# Patient Record
Sex: Female | Born: 1960
Health system: Southern US, Community
[De-identification: ages and names within clinical notes are randomized; demographics above are authoritative.]

## PROBLEM LIST (undated history)

## (undated) DIAGNOSIS — I10 Essential (primary) hypertension: Secondary | ICD-10-CM

## (undated) DIAGNOSIS — I2699 Other pulmonary embolism without acute cor pulmonale: Secondary | ICD-10-CM

## (undated) DIAGNOSIS — K219 Gastro-esophageal reflux disease without esophagitis: Secondary | ICD-10-CM

## (undated) DIAGNOSIS — C801 Malignant (primary) neoplasm, unspecified: Secondary | ICD-10-CM

## (undated) DIAGNOSIS — J45909 Unspecified asthma, uncomplicated: Secondary | ICD-10-CM

## (undated) DIAGNOSIS — G4730 Sleep apnea, unspecified: Secondary | ICD-10-CM

## (undated) DIAGNOSIS — E119 Type 2 diabetes mellitus without complications: Secondary | ICD-10-CM

## (undated) DIAGNOSIS — K56600 Partial intestinal obstruction, unspecified as to cause: Secondary | ICD-10-CM

## (undated) HISTORY — DX: Essential (primary) hypertension: I10

## (undated) HISTORY — DX: Type 2 diabetes mellitus without complications: E11.9

## (undated) HISTORY — DX: Unspecified asthma, uncomplicated: J45.909

---
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
## 2000-02-14 ENCOUNTER — Other Ambulatory Visit: Admission: RE | Admit: 2000-02-14 | Discharge: 2000-02-14 | Payer: Self-pay | Admitting: Gynecology

## 2000-03-25 ENCOUNTER — Encounter: Admission: RE | Admit: 2000-03-25 | Discharge: 2000-06-23 | Payer: Self-pay | Admitting: Gynecology

## 2000-04-17 ENCOUNTER — Encounter (INDEPENDENT_AMBULATORY_CARE_PROVIDER_SITE_OTHER): Payer: Self-pay

## 2000-04-17 ENCOUNTER — Ambulatory Visit (HOSPITAL_COMMUNITY): Admission: RE | Admit: 2000-04-17 | Discharge: 2000-04-17 | Payer: Self-pay | Admitting: Gynecology

## 2000-10-28 ENCOUNTER — Encounter (INDEPENDENT_AMBULATORY_CARE_PROVIDER_SITE_OTHER): Payer: Self-pay

## 2000-10-28 ENCOUNTER — Inpatient Hospital Stay (HOSPITAL_COMMUNITY): Admission: RE | Admit: 2000-10-28 | Discharge: 2000-10-30 | Payer: Self-pay | Admitting: Gynecology

## 2000-11-10 ENCOUNTER — Encounter: Payer: Self-pay | Admitting: Gynecology

## 2000-11-10 ENCOUNTER — Ambulatory Visit (HOSPITAL_COMMUNITY): Admission: RE | Admit: 2000-11-10 | Discharge: 2000-11-10 | Payer: Self-pay | Admitting: Gynecology

## 2000-11-12 ENCOUNTER — Encounter (INDEPENDENT_AMBULATORY_CARE_PROVIDER_SITE_OTHER): Payer: Self-pay | Admitting: *Deleted

## 2000-11-12 ENCOUNTER — Inpatient Hospital Stay (HOSPITAL_COMMUNITY): Admission: EM | Admit: 2000-11-12 | Discharge: 2000-11-20 | Payer: Self-pay | Admitting: Emergency Medicine

## 2000-11-12 ENCOUNTER — Encounter (INDEPENDENT_AMBULATORY_CARE_PROVIDER_SITE_OTHER): Payer: Self-pay | Admitting: Specialist

## 2000-11-14 ENCOUNTER — Encounter: Payer: Self-pay | Admitting: Surgery

## 2001-01-06 HISTORY — PX: ABDOMINAL HYSTERECTOMY: SHX81

## 2004-08-09 ENCOUNTER — Emergency Department (HOSPITAL_COMMUNITY): Admission: EM | Admit: 2004-08-09 | Discharge: 2004-08-09 | Payer: Self-pay | Admitting: Emergency Medicine

## 2004-08-27 ENCOUNTER — Encounter: Admission: RE | Admit: 2004-08-27 | Discharge: 2004-11-25 | Payer: Self-pay | Admitting: Family Medicine

## 2004-10-06 HISTORY — PX: HERNIA REPAIR: SHX51

## 2005-07-24 ENCOUNTER — Ambulatory Visit: Payer: Self-pay | Admitting: Family Medicine

## 2005-07-28 ENCOUNTER — Ambulatory Visit: Payer: Self-pay | Admitting: Family Medicine

## 2005-08-13 ENCOUNTER — Inpatient Hospital Stay (HOSPITAL_COMMUNITY): Admission: RE | Admit: 2005-08-13 | Discharge: 2005-08-17 | Payer: Self-pay | Admitting: Surgery

## 2005-09-03 ENCOUNTER — Encounter: Admission: RE | Admit: 2005-09-03 | Discharge: 2005-09-03 | Payer: Self-pay | Admitting: Surgery

## 2005-09-05 ENCOUNTER — Ambulatory Visit: Payer: Self-pay | Admitting: Family Medicine

## 2005-09-17 ENCOUNTER — Encounter: Admission: RE | Admit: 2005-09-17 | Discharge: 2005-09-17 | Payer: Self-pay | Admitting: Surgery

## 2005-09-24 ENCOUNTER — Ambulatory Visit: Payer: Self-pay | Admitting: Family Medicine

## 2005-12-08 ENCOUNTER — Ambulatory Visit: Payer: Self-pay | Admitting: Family Medicine

## 2005-12-08 LAB — CONVERTED CEMR LAB
Chloride: 106 meq/L (ref 96–112)
Chol/HDL Ratio, serum: 5.1
Cholesterol: 216 mg/dL (ref 0–200)
Creatinine, Ser: 0.7 mg/dL (ref 0.4–1.2)
Glucose, Bld: 131 mg/dL — ABNORMAL HIGH (ref 70–99)
HDL: 42 mg/dL (ref >39.0)
LDL DIRECT: 173.7 mg/dL
Sodium: 140 meq/L (ref 135–145)

## 2006-01-08 ENCOUNTER — Ambulatory Visit: Payer: Self-pay | Admitting: Family Medicine

## 2006-01-08 LAB — CONVERTED CEMR LAB
BUN: 9 mg/dL (ref 6–23)
Chloride: 103 meq/L (ref 96–112)
Creatinine, Ser: 0.7 mg/dL (ref 0.4–1.2)

## 2006-02-10 ENCOUNTER — Ambulatory Visit: Payer: Self-pay | Admitting: Family Medicine

## 2006-02-10 LAB — CONVERTED CEMR LAB
BUN: 13 mg/dL (ref 6–23)
CO2: 27 meq/L (ref 19–32)
Calcium: 9.7 mg/dL (ref 8.4–10.5)
Chloride: 107 meq/L (ref 96–112)
Creatinine, Ser: 0.7 mg/dL (ref 0.4–1.2)

## 2006-04-15 ENCOUNTER — Ambulatory Visit: Payer: Self-pay | Admitting: Family Medicine

## 2006-04-15 LAB — CONVERTED CEMR LAB
ALT: 14 units/L (ref 0–40)
Creatinine, Ser: 0.7 mg/dL (ref 0.4–1.2)
Creatinine,U: 87.7 mg/dL
GFR calc non Af Amer: 97 mL/min
HDL: 48 mg/dL (ref >39.0)
LDL Cholesterol: 60 mg/dL (ref 0–99)
Potassium: 4.6 meq/L (ref 3.5–5.1)
Sodium: 140 meq/L (ref 135–145)
Triglycerides: 123 mg/dL (ref 0–149)
VLDL: 25 mg/dL (ref 0–40)

## 2006-05-26 ENCOUNTER — Ambulatory Visit: Payer: Self-pay | Admitting: Family Medicine

## 2006-05-26 LAB — CONVERTED CEMR LAB
Calcium: 9.6 mg/dL (ref 8.4–10.5)
Chloride: 105 meq/L (ref 96–112)
GFR calc non Af Amer: 116 mL/min
Glucose, Bld: 110 mg/dL — ABNORMAL HIGH (ref 70–99)

## 2006-06-05 DIAGNOSIS — J45909 Unspecified asthma, uncomplicated: Secondary | ICD-10-CM | POA: Insufficient documentation

## 2006-06-05 DIAGNOSIS — E785 Hyperlipidemia, unspecified: Secondary | ICD-10-CM

## 2006-06-05 DIAGNOSIS — I10 Essential (primary) hypertension: Secondary | ICD-10-CM | POA: Insufficient documentation

## 2006-06-05 DIAGNOSIS — E119 Type 2 diabetes mellitus without complications: Secondary | ICD-10-CM | POA: Insufficient documentation

## 2006-08-04 ENCOUNTER — Ambulatory Visit: Payer: Self-pay | Admitting: Family Medicine

## 2006-08-04 LAB — CONVERTED CEMR LAB
BUN: 14 mg/dL (ref 6–23)
CO2: 27 meq/L (ref 19–32)
GFR calc Af Amer: 117 mL/min
Glucose, Bld: 114 mg/dL — ABNORMAL HIGH (ref 70–99)
Potassium: 4.5 meq/L (ref 3.5–5.1)
Sodium: 138 meq/L (ref 135–145)
Total CHOL/HDL Ratio: 4.5
Triglycerides: 171 mg/dL — ABNORMAL HIGH (ref 0–149)

## 2006-08-05 ENCOUNTER — Telehealth (INDEPENDENT_AMBULATORY_CARE_PROVIDER_SITE_OTHER): Payer: Self-pay | Admitting: *Deleted

## 2006-11-05 ENCOUNTER — Ambulatory Visit: Payer: Self-pay | Admitting: Family Medicine

## 2006-11-05 LAB — CONVERTED CEMR LAB
CO2: 26 meq/L (ref 19–32)
Chloride: 104 meq/L (ref 96–112)
Creatinine, Ser: 0.7 mg/dL (ref 0.4–1.2)
Glucose, Bld: 122 mg/dL — ABNORMAL HIGH (ref 70–99)
Sodium: 139 meq/L (ref 135–145)

## 2006-11-06 ENCOUNTER — Encounter (INDEPENDENT_AMBULATORY_CARE_PROVIDER_SITE_OTHER): Payer: Self-pay | Admitting: *Deleted

## 2006-11-06 ENCOUNTER — Telehealth (INDEPENDENT_AMBULATORY_CARE_PROVIDER_SITE_OTHER): Payer: Self-pay | Admitting: *Deleted

## 2006-12-21 ENCOUNTER — Telehealth (INDEPENDENT_AMBULATORY_CARE_PROVIDER_SITE_OTHER): Payer: Self-pay | Admitting: *Deleted

## 2007-01-13 ENCOUNTER — Telehealth (INDEPENDENT_AMBULATORY_CARE_PROVIDER_SITE_OTHER): Payer: Self-pay | Admitting: *Deleted

## 2007-01-13 ENCOUNTER — Ambulatory Visit: Payer: Self-pay | Admitting: Family Medicine

## 2007-01-13 DIAGNOSIS — J069 Acute upper respiratory infection, unspecified: Secondary | ICD-10-CM | POA: Insufficient documentation

## 2007-01-14 ENCOUNTER — Ambulatory Visit: Payer: Self-pay | Admitting: Family Medicine

## 2007-01-19 ENCOUNTER — Encounter (INDEPENDENT_AMBULATORY_CARE_PROVIDER_SITE_OTHER): Payer: Self-pay | Admitting: *Deleted

## 2007-01-19 LAB — CONVERTED CEMR LAB
GFR calc non Af Amer: 83 mL/min
Glucose, Bld: 134 mg/dL — ABNORMAL HIGH (ref 70–99)
HDL: 42.1 mg/dL (ref >39.0)
Hgb A1c MFr Bld: 7.1 % — ABNORMAL HIGH (ref 4.6–6.0)
LDL Cholesterol: 112 mg/dL — ABNORMAL HIGH (ref 0–99)
Potassium: 4.2 meq/L (ref 3.5–5.1)
Sodium: 139 meq/L (ref 135–145)
Triglycerides: 160 mg/dL — ABNORMAL HIGH (ref 0–149)
VLDL: 32 mg/dL (ref 0–40)

## 2007-02-01 ENCOUNTER — Telehealth (INDEPENDENT_AMBULATORY_CARE_PROVIDER_SITE_OTHER): Payer: Self-pay | Admitting: *Deleted

## 2007-02-05 ENCOUNTER — Telehealth (INDEPENDENT_AMBULATORY_CARE_PROVIDER_SITE_OTHER): Payer: Self-pay | Admitting: *Deleted

## 2007-02-15 ENCOUNTER — Telehealth (INDEPENDENT_AMBULATORY_CARE_PROVIDER_SITE_OTHER): Payer: Self-pay | Admitting: *Deleted

## 2007-02-15 IMAGING — CT CT PELVIS W/O CM
2 of 4 series · 17 of 46 positions shown, 19 images · IV contrast (agent unspecified)
Comparison: none

CLINICAL DATA: Postop ventral hernia repair.  Question abscess. 
 ABDOMEN CT WITHOUT CONTRAST:
TECHNIQUE: Multidetector CT imaging of the abdomen was performed following the standard protocol without IV contrast.
 Apparent scarring is noted in the left lower lobe.  The liver appears normal in the unenhanced state.  [REDACTED] be a small hiatal hernia present.  The gallbladder is visualized with no calcified gallstones.  The pancreas is normal in size as are the adrenal glands and the spleen. The kidneys appear normal in the unenhanced state with no calculi.  The abdominal aorta is normal in caliber.
TECHNIQUE: Multidetector CT imaging of the pelvis was performed following the standard protocol without IV contrast.
 The mesh for repair of midline abdominal wall hernia within the pelvis is noted.  Along the inferior margin of the mesh, there is a linear collection of air which extends toward the skin surface.  There is also a strandiness of the surrounding soft tissues, some of which could be postoperative, but in view of the residual air three weeks postop, cellulitis is a consideration.  There is a small oval area of low attenuation within the soft tissues of the right panniculus which may represent a small abscess of no more than 37 x 25 mm in diameter.  No intraperitoneal abscess is seen. The right rectus musculature is slightly prominent postoperatively. The urinary bladder is unremarkable.  The patient has previously undergone hysterectomy. The appendix appears normal.

[Series 2: routine abd/pelvis · axial · 0.97mm/px · z∈[-435,-20]mm · 14 of 91 slices shown, 16 images]
[im 4/91  soft-tissue]
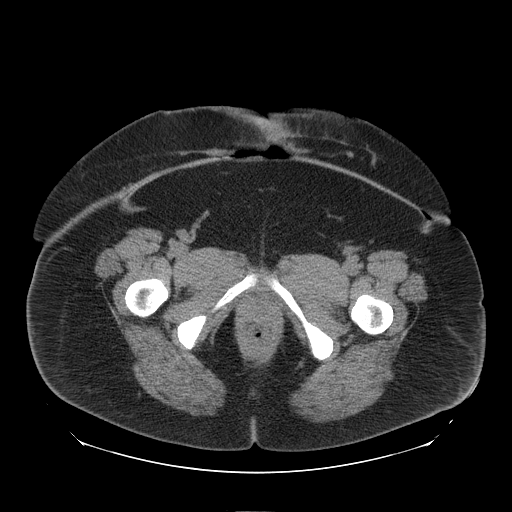
[im 4/91  bone]
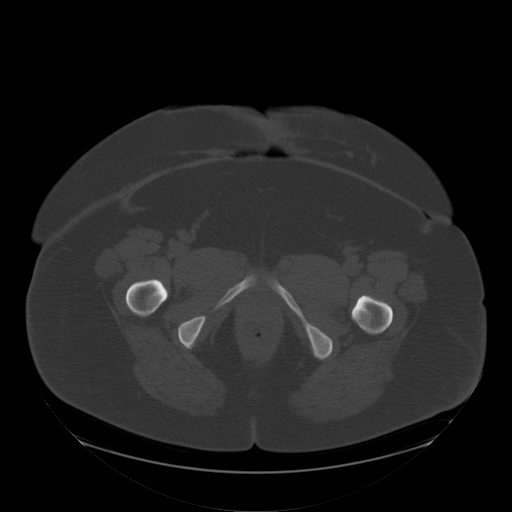
[im 11/91  soft-tissue]
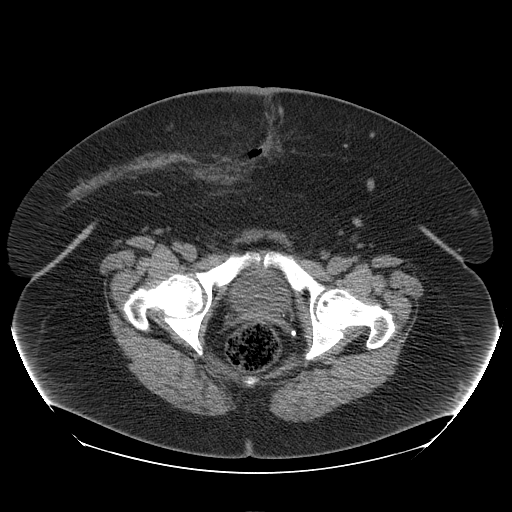
[im 18/91  soft-tissue]
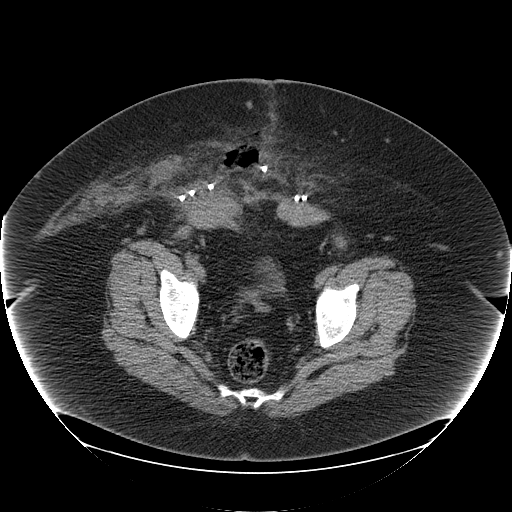
[im 25/91  soft-tissue]
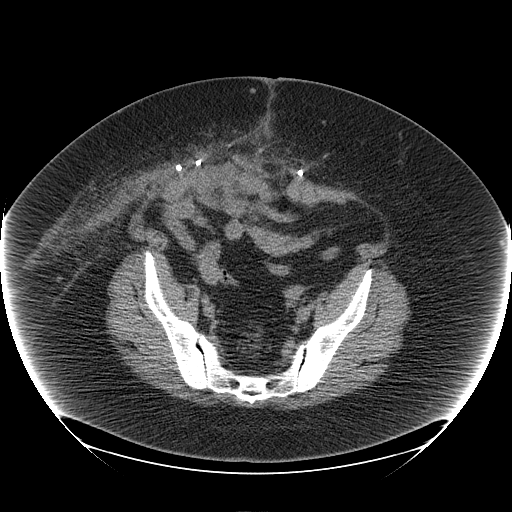
[im 32/91  soft-tissue]
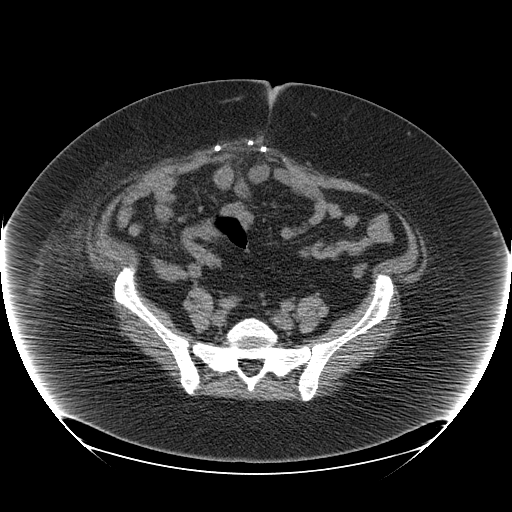
[im 35/91  soft-tissue]
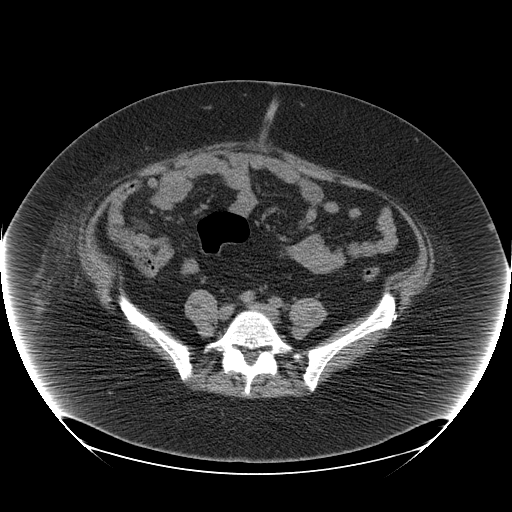
[im 42/91  soft-tissue]
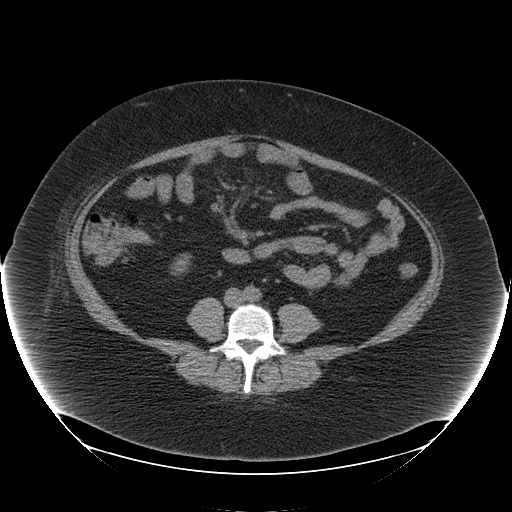
[im 49/91  soft-tissue]
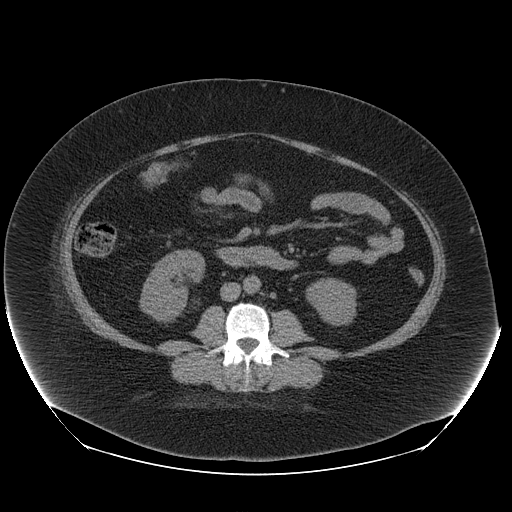
[im 56/91  soft-tissue]
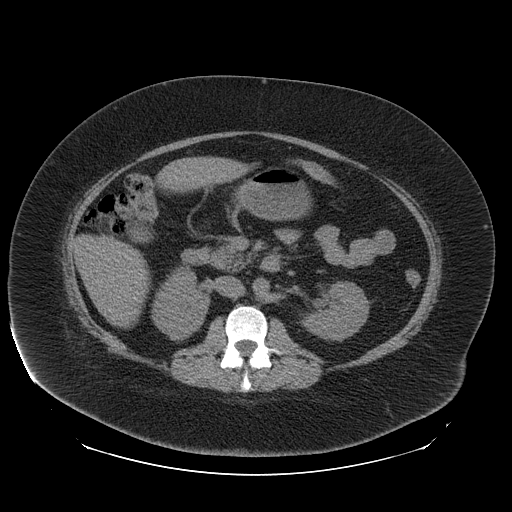
[im 56/91  bone]
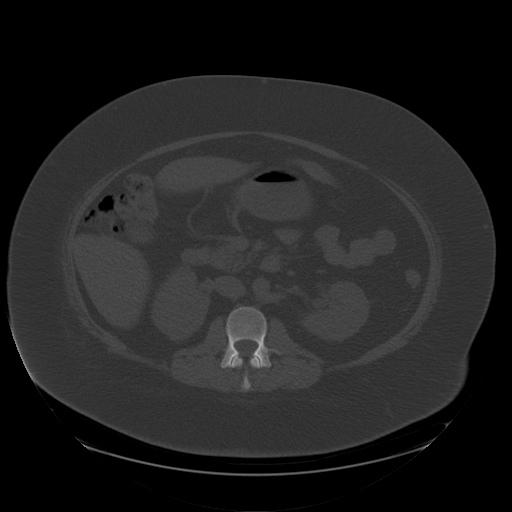
[im 59/91  soft-tissue]
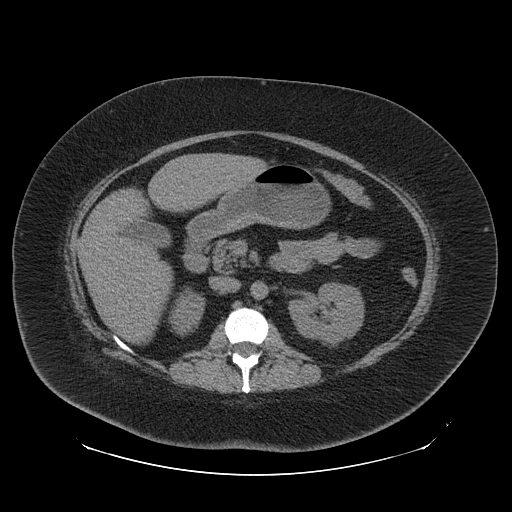
[im 66/91  soft-tissue]
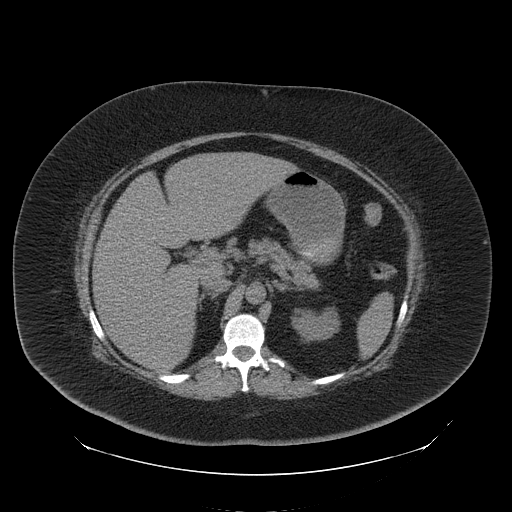
[im 73/91  soft-tissue]
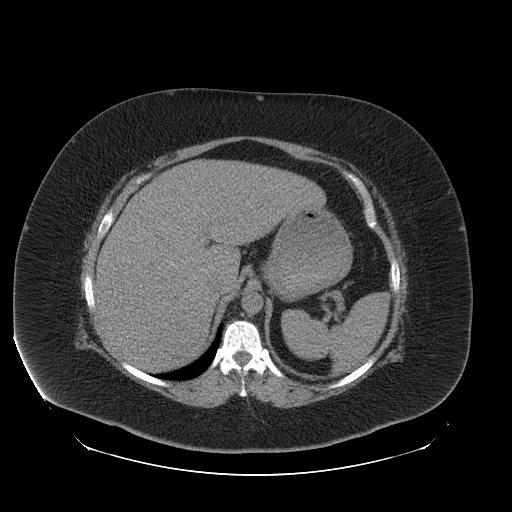
[im 80/91  soft-tissue]
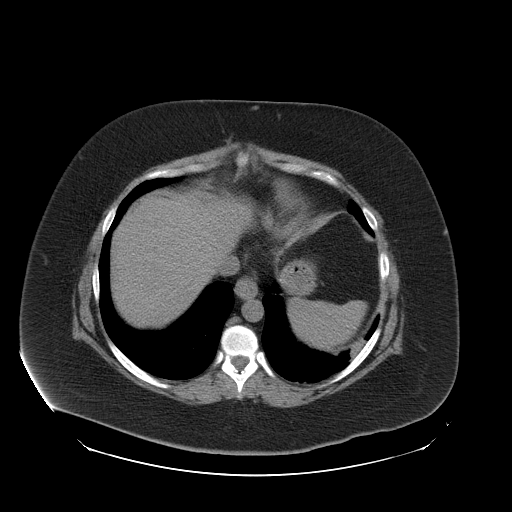
[im 87/91  soft-tissue]
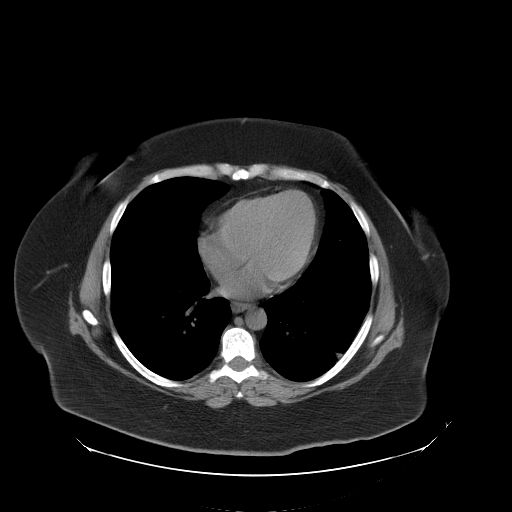

[Series 527: reformatted · sagittal · 0.97mm/px · 3 of 176 slices shown]
[im 59/176  soft-tissue]
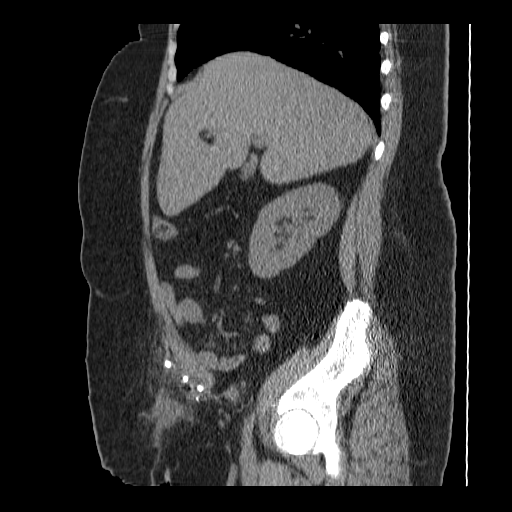
[im 78/176  soft-tissue]
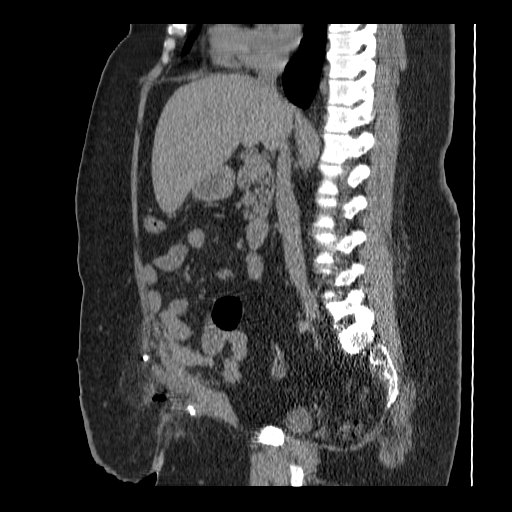
[im 98/176  soft-tissue]
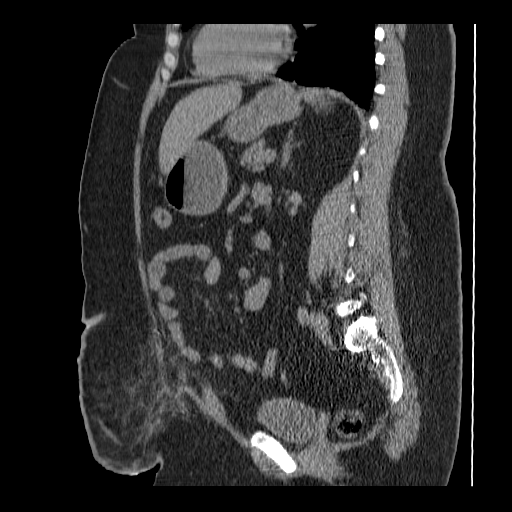

[17 of 46 positions shown; findings below may reference images not displayed]

IMPRESSION: Negative CT of the abdomen. 
 PELVIS CT WITHOUT CONTRAST:
IMPRESSION: 1.  Linear collection of air tracks from the level of the inferior aspect of the mesh toward the skin surface. With surrounding strandiness and small fluid collection in the right panniculus, cellulitis and small abscess is a consideration.
 2.  Appendix appears normal.

## 2007-06-14 ENCOUNTER — Telehealth (INDEPENDENT_AMBULATORY_CARE_PROVIDER_SITE_OTHER): Payer: Self-pay | Admitting: *Deleted

## 2007-06-23 ENCOUNTER — Ambulatory Visit: Payer: Self-pay | Admitting: Vascular Surgery

## 2007-06-23 ENCOUNTER — Encounter: Payer: Self-pay | Admitting: Family Medicine

## 2007-06-23 ENCOUNTER — Ambulatory Visit (HOSPITAL_COMMUNITY): Admission: RE | Admit: 2007-06-23 | Discharge: 2007-06-23 | Payer: Self-pay | Admitting: Family Medicine

## 2007-07-27 ENCOUNTER — Encounter: Admission: RE | Admit: 2007-07-27 | Discharge: 2007-07-27 | Payer: Self-pay | Admitting: Obstetrics & Gynecology

## 2007-08-10 ENCOUNTER — Ambulatory Visit: Payer: Self-pay | Admitting: Vascular Surgery

## 2007-10-19 ENCOUNTER — Ambulatory Visit: Payer: Self-pay | Admitting: Vascular Surgery

## 2007-11-29 ENCOUNTER — Ambulatory Visit: Payer: Self-pay | Admitting: Vascular Surgery

## 2007-12-06 ENCOUNTER — Ambulatory Visit: Payer: Self-pay | Admitting: Vascular Surgery

## 2007-12-23 ENCOUNTER — Ambulatory Visit: Payer: Self-pay | Admitting: Vascular Surgery

## 2008-01-13 ENCOUNTER — Ambulatory Visit: Payer: Self-pay | Admitting: Vascular Surgery

## 2008-08-01 ENCOUNTER — Encounter: Admission: RE | Admit: 2008-08-01 | Discharge: 2008-08-01 | Payer: Self-pay | Admitting: Emergency Medicine

## 2008-08-16 ENCOUNTER — Ambulatory Visit (HOSPITAL_COMMUNITY): Admission: RE | Admit: 2008-08-16 | Discharge: 2008-08-16 | Payer: Self-pay | Admitting: Gastroenterology

## 2010-05-21 NOTE — Assessment & Plan Note (Signed)
OFFICE VISIT   JAMYRIA, OZANICH  DOB:  [birth year]                                       10/19/2007  WUJWJ#:[id number]   The patient returns for further followup regarding severe venous  insufficiency of the left leg.  She has painful varicosities of the  lower thigh and calf as well as painful reticular veins in the lateral  calf area secondary to severe reflux in the left great saphenous vein up  to and including the saphenofemoral junction.  Her deep system is widely  patent.  She has been wearing long-leg elastic compression stockings  (graduated compression 20 mm - 30 mm) and trying elevation of the leg as  much as her job will permit, and ibuprofen, and has had no improvement  in symptomatology.  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  Her legs become more and more symptomatic as the day  progresses.  She also has had some early skin changes in the left ankle.   EXAM:  Today blood pressure 124/78, heart rate is 70, respirations 14.  The painful varicosities are involving the lower thigh and mainly medial  calf area as well as some prominent reticular and spider veins on the  lateral aspect of the calf with some early skin changes distally and  mild edema.   I think we should proceed with laser ablation of the left great  saphenous vein with multiple stab phlebectomies and 2 sessions of  sclerotherapy following this to treat these painful varicosities.  We  will proceed with certification through the insurance company and hope  to perform her procedure in the near future.   Quita Skye Hart [place], M.D.  Electronically Signed   JDL/MEDQ  D:  10/19/2007  T:  10/20/2007  Job:  [id number]

## 2010-05-21 NOTE — Consult Note (Signed)
VASCULAR SURGERY CONSULTATION   Shelly, Russell  DOB:  1960-07-09                                       08/10/2007  GUYQI#:[id number]   The patient was referred by Dr. Alwyn Ren for vascular surgery evaluation  regarding venous insufficiency of both legs.  This 50 year old female  patient who works as a Designer, industrial/product that she has been having increasing  discomfort in the left leg over the last few years particularly the last  several months.  She had an episode of thrombophlebitis in the left leg  in June of this year and was treated with heat and analgesics which  eventually improved but did not completely resolve.  This caused pain in  her left medial calf, medial thigh, and lateral calf.  She does have  some chronic swelling in the leg particularly in the ankle as the day  progresses.  She has not worn elastic compression stockings, elevates  her legs when she is able to, but this is not frequently because of her  job and takes ibuprofen for pain without success.  She describes it as  an aching, throbbing, burning discomfort in the left leg.  She has  symptoms in the right leg as well in the lateral thigh area but not as  severe.   PAST MEDICAL HISTORY:  1. Diabetes.  2. Hypertension.  3. Asthma.  4. Negative for coronary artery disease, stroke.   PREVIOUS SURGERY:  Includes hysterectomy and hernia repair.   FAMILY HISTORY:  Positive for thrombophlebitis in her father.  She also  has a history of coronary artery disease in her father who died of  myocardial infarction.  Negative for stroke.   SOCIAL HISTORY:  Does not use tobacco or alcohol.  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   ALLERGIES:  To seafood, penicillin and IV contrast.   PHYSICAL EXAM:  Blood pressure is 180/82, heart rate 70, respirations  14.  General:  She is a middle-aged female in no apparent distress.  Alert and oriented x3.  Neck:  Supple 3+ carotid pulses palpable.  No  bruits are  audible.  Neurologic:  Normal with no palpable adenopathy in  neck.  Chest:  Clear to auscultation.  Cardiovascular:  Regular rhythm.  No murmurs.  No skin rashes noted.  Upper extremity pulses 3+  bilaterally.  Abdomen:  Obese.  No palpable masses.  She has 3+ femoral,  popliteal and 2+ dorsalis pedis pulses palpable bilaterally.  Left leg  has significant right greater saphenous varicosities particularly below  the knee medially with large bulbous varicosities and spider and  reticular veins laterally in the calf as well as some palpable old  thrombus in the superficial vein laterally from old thrombophlebitis.  She has 1+ edema distally with no active ulceration.  Right leg has some  prominent varicosities in the distal thigh anteriorly extending around  to the knee but no medial varicosities or posterior varicosities.   Venous duplex exam was performed today which revealed gross reflux in  left greater saphenous vein from the saphenofemoral junction to the knee  with evidence of some old thrombus in the left calf medially but not  above the knee.  Deep system is widely patent.  There is no reflux noted  on the right side.   I think she does have significant symptomatic  venous insufficiency  secondary to reflux in her left great saphenous vein and recommended  elastic compression stockings, elevation of the leg as much as possible,  as well as regular analgesics to see if we can relieve her symptoms.  Return in 3 months and if we are unsuccessful then we should proceed  with laser ablation left great saphenous vein with multiple stab  phlebectomies.  She may require sclerotherapy of the right leg at a  later date.   [name], M.D.  Electronically Signed  JDL/MEDQ  D:  08/10/2007  T:  08/11/2007  Job:  [id number]   cc:   Titus Dubin. Alwyn Ren, MD,FACP,FCCP

## 2010-05-21 NOTE — Procedures (Signed)
LOWER EXTREMITY VENOUS REFLUX EXAM   INDICATION:  Bilateral leg varicose veins with pain and swelling.   EXAM:  Using color-flow imaging and pulse Doppler spectral analysis, the  right and left common femoral, superficial femoral, popliteal, posterior  tibial, greater and lesser saphenous veins are evaluated.  There is no  evidence suggesting deep venous insufficiency in the right and left  lower extremity.   The left saphenofemoral junction is not competent.  The left GSV is not  competent with the caliber as described below.   The right and left proximal short saphenous vein demonstrated  competency.   GSV Diameter (used if found to be incompetent only)                                            Right    Left  Proximal Greater Saphenous Vein           cm       0.88 cm  Proximal-to-mid-thigh                     cm       0.88 cm  Mid thigh                                 cm       0.74 cm  Mid-distal thigh                          cm       0.74 cm  Distal thigh                              cm       0.95 cm  Knee                                      cm       0.59 cm   IMPRESSION:  1. The left greater saphenous vein reflux is identified with the      caliber ranging from 0.59 cm to 1.24 cm knee to groin.  2. The right and left greater saphenous vein is not aneurysmal.  3. The right and left greater saphenous vein is not tortuous.  4. The deep venous system is competent.  5. The right and left lesser saphenous vein is competent.  6. No evidence of deep venous thrombosis noted in bilateral legs.  7. Chronic thrombus noted in the left greater saphenous vein, mid to      distal thigh.      ___________________________________________  [name]. Hart [place], M.D.   MG/MEDQ  D:  08/10/2007  T:  08/10/2007  Job:  [id number]

## 2010-05-21 NOTE — Assessment & Plan Note (Signed)
OFFICE VISIT   [name], [name]  DOB:  02-27-1960                                       12/06/2007  OZHYQ#:[id number]   The patient had laser ablation of her left great saphenous vein with  multiple stab phlebectomies performed 1 week ago for painful  varicosities.  She has had an excellent early result.  She had some mild  discomfort and aching along the course of her great saphenous vein from  the saphenofemoral junction to the mid thigh, which has diminished and  essentially disappeared.  She has had no pain at the stab phlebectomy  sites and no swelling distally.  She has worn her elastic stockings as  instructed and has taken her ibuprofen.   EXAM:  The stab phlebectomy wounds are healing nicely with minimal  tenderness along the course of the great saphenous vein.   Venous duplex revealed total occlusion of the great saphenous vein with  no obstruction in the deep venous system.  She is reassured regarding  these findings and will now be scheduled for 2 sessions of sclerotherapy  to complete her treatment as approved by the insurance company.   Quita Skye Hart [place], M.D.  Electronically Signed   JDL/MEDQ  D:  12/06/2007  T:  12/07/2007  Job:  [id number]

## 2010-05-21 NOTE — Procedures (Signed)
DUPLEX DEEP VENOUS EXAM - LOWER EXTREMITY   INDICATION:  Evaluation status post left leg laser ablation of greater  saphenous vein.   HISTORY:  Edema:  No.  Trauma/Surgery:  On November 29, 2007, the patient had the left greater  saphenous vein ablated by laser.  Pain:  Patient complains of itchiness in the left leg.  PE:  No.  Previous DVT:  No.  Anticoagulants:  No.  Other:  No.   DUPLEX EXAM:                CFV   SFV   PopV  PTV    GSV                R  L  R  L  R  L  R   L  R  L  Thrombosis    0  0     0     0      0     0  Spontaneous   +  +     +     +      +     0  Phasic        +  +     +     +      +     0  Augmentation  +  +     +     +      +     0  Compressible  +  +     +     +      +     0  Competent     +  +     +     +      +     0   Legend:  + - yes  o - no  p - partial  D - decreased   IMPRESSION:  1. Left greater saphenous vein is thrombosed and occluded from the      saphenofemoral junction into the calf.  2. Left circumflex vein and left common femoral vein demonstrate no      thrombus.  3. The competent anterior branch of the left greater saphenous vein is      still patent.  4. No evidence of left leg deep vein thrombus or Baker's cyst.    _____________________________  Quita Skye. Hart [place], M.D.   MC/MEDQ  D:  12/06/2007  T:  12/06/2007  Job:  [id number]

## 2010-05-24 NOTE — Op Note (Signed)
[name], REINECKE               ACCOUNT NO.:  [id number]   MEDICAL RECORD NO.:  [id number]          PATIENT TYPE:  INP   LOCATION:  0003                         FACILITY:  [facility]   PHYSICIAN:  Thomas A. Cornett, M.D.DATE OF BIRTH:  [birth year]   DATE OF PROCEDURE:  08/13/2005  DATE OF DISCHARGE:                                 OPERATIVE REPORT   PREOPERATIVE DIAGNOSIS:  Large ventral hernia.   POSTOPERATIVE DIAGNOSIS:  Ventral hernia measuring 15 x 15 cm.   PROCEDURE:  Open ventral hernia repair with polypropylene mesh, using onlay  technique.   SURGEON:  Maisie Fus A. Cornett, M.D.   ANESTHESIA:  General endotracheal anesthesia.   ASSISTANT:  Leonie Man, M.D.   ESTIMATED BLOOD LOSS:  50 cc.   DRAINS:  One 44 Blake drain to seroma cavity.   INDICATIONS FOR PROCEDURE:  The patient is a 50 year old female with morbid  obesity who had a previous hysterectomy.  She developed a large right lower  quadrant incision from her previous incision, and this required repair.  She  is here today to have that done.   DESCRIPTION OF PROCEDURE:  After receiving informed consent from the  patient, the patient was brought to the operating room and placed supine.  After induction of general endotracheal anesthesia, the abdomen is prepped  and draped in a sterile fashion.  She received preoperative antibiotics, an  orogastric tube was placed, and a Foley catheter was placed.  Of note, the  orogastric tube was switched to a nasogastric tube during the case.  After  sterile prep and drape and adequate anesthesia, a midline incision from just  below the umbilicus down toward the pubic symphysis was used.  We dissected  through a very thick layer of subcutaneous fat.  The patient had a very  large pannus, and it was very difficult.  We were able to identify the  hernia sac.  We were able to then dissect the hernia sac from the  subcutaneous fat.  This sac tracked all the way down toward her pubic  bone  to the right side of the midline.  Once we got the hernia sac, we opened it,  and there was considerable small bowel within the hernia sac.  We were able  to reduce the small bowel back into the abdominal cavity and define the  fascial edge circumferentially.  We took down any adhesions between hernia  sac and the small bowel, and there were very few actually.  Once reduced the  small bowel back into the abdominal cavity, we used the cautery to excise  any excess hernia sac, which there was quite a bit of.  We were then able to  use Kochers to grab all fascial edges defined.  We were able to clear the  entire fascial edge and take down any adhesions.  This was located just to  the right of her midline, corresponding to a previous lower abdominal  incision from her hysterectomy many years ago.  At this point in time, since  she was so obese, it was very difficult to see the inside  of this ring of  fascia from the standpoint of doing an intraperitoneal piece of mesh to  close this.  I felt that an onlay would be a better repair in this setting  given her anatomy and her large size, which was going to make it very  difficult to secure the mesh laterally, since the defect was to the right of  the midline toward the right lower quadrant and actually extended all they  way almost to her right anterior superior iliac spine.  Total area was  roughly 15 x 15 cm.  She did have enough fascial tissue to close, and we  felt we could close the defect primarily, then use an onlay piece of  polypropylene mesh in an attempt to get this to close with minimal tension.  I circumferentially placed a #1 Novofil circumferentially through the fascia  to help secure the mesh with an onlay technique.  I then closed the defect  with interrupted #1 Novofils to close the defect quite nicely.  The  instruments were counted prior to closing the abdomen, and these were found  to be correct.  An at this point, once  we got the fascia closed, I used a  large piece of polypropylene mesh, cut to about 20 x 20 cm for adequate  overlap.  I then secured it to the top side of the fascia with the  previously placed Novofil sutures.  I then used a ProTack device to help  create two layers of tacks inside the suture line to tack it to the fascia.  The repair appeared quite adequate and seemed to be satisfactory.  There was  a large subcutaneous area of dead space from the hernia sac on the right  lower quadrant subcutaneous fat.  I felt a drain was needed to keep this  from having a postoperative seroma.  Through a separate stab incision, I  placed a 19 Blake drain into the right lower quadrant subcutaneous fat and  secured it with a 3-0 nylon.  I then close the subcutaneous fat down to the  fascia using #1 Vicryl to try to obliterate as much dead space and promote  tissue integration as possible.  Care was taken not to catch the drain in  any of these stitches.  At this point in time, I used irrigation prior to  closing the skin.  This was suctioned out.  Skin was then closed with  staples.  All final counts of sponge, needle, and instruments were found to  be correct at this portion of the case.  The patient was then awakened and  taken to recovery in satisfactory condition.      Thomas A. Cornett, M.D.  Electronically Signed     TAC/MEDQ  D:  08/13/2005  T:  08/13/2005  Job:  [id number]   cc:   Leanne Chang, M.D.  Fax: [phone]

## 2010-05-24 NOTE — Discharge Summary (Signed)
[facility]  Patient:    [name], [name] Visit Number: [id number] MRN: [id number]          Service Type: SUR Location: 4W 0456 02 Attending Physician:  Charlton Haws Dictated by:   Currie Paris, M.D. Admit Date:  11/12/2000 Discharge Date: 11/20/2000   CC:         [name]. Lily Peer, M.D.   Discharge Summary  VISIT NUMBER:  [id number]  FINAL DIAGNOSES:  Necrotizing wound infection and panniculitis.  CLINICAL HISTORY:  This patient is a 50 year old woman who is about two weeks status post hysterectomy done through a Pfannenstiel incision.  She is obese and had a panniculus and developed a necrotizing wound infection involving the length of the panniculus.  She was admitted on November 12, 2000.  HOSPITAL COURSE:  The patient was admitted and taken fairly directly to the operating room where under general anesthesia the necrotic skin of the panniculus was excised and the wound debrided down to fascia.  There was a large quantity of purulent material and necrotic fatty tissue, but no evidence of a fasciitis.  The wound was copiously irrigated using the hydroirrigator and packed with saline-moistened gauze.  The patient tolerated that nicely and was stable overnight.  She was taken to the operating room again the next day where a dressing change was done under anesthesia with additional debridement. Again that was tolerated nicely.  She was begun on Cipro and Flagyl initially. Cultures showed a coagulase-negative Staphylococcus, but Grams stain had shown both gram-negative rods and gram-positive cocci.  A PICC line was placed on November 14, 2000, for IV access.  She remained doing well over the next several days and was having daily wound changes, which were able to be done at the bedside.  However, it appeared on November 17, 2000, that she was developing some additional skin necrosis at the lateral edges of the incision, most noticeably  to the right, but a little bit to the left side as well.  She was taken to the operating room on November 18, 2000, where the necrotic skin was again debrided laterally.  A little bit of more fatty tissue was debrided that had become necrotic and irrigated out again.  However, the wound basically looked very clean, except for these two edges.  This was again tolerated well.  She remained afebrile.  She was kept in the hospital until November 20, 2000, with daily wound changes.  At this point, the wound was looking clean, there was no additional necrosis, and it was felt that she could be discharged with wound care at home and oral antibiotics.  No additional information was obtained from cultures.  DISPOSITION:  The patient was discharged in satisfactory condition.  DISCHARGE MEDICATIONS:  Cipro 500 mg b.i.d.  ACTIVITY:  Limited.  WOUND CARE:  Daily dressing changes which had been arranged for by home health care.  LABORATORY STUDIES:  Hemoglobin 9.5 at discharge with a white count that had drifted down to 13 from her initial 17.  Pathology simply showed a soft tissue abscess with necrotic tissue and no evidence of any tumor or other problems. Dictated by:   Currie Paris, M.D. Attending Physician:  Charlton Haws DD:  11/20/00 TD:  11/20/00 Job: [id number]

## 2010-05-24 NOTE — Op Note (Signed)
[facility]  Patient:    [name], [name] Visit Number: [id number] MRN: [id number]          Service Type: GYN Location: 9300 [id number] [id number] Attending Physician:  Tonye Royalty Dictated by:   [name]. Lily Peer, M.D. Proc. Date: 10/28/00 Admit Date:  10/28/2000                             Operative Report  PREOPERATIVE DIAGNOSES:       1. Menometrorrhagia.                               2. Leiomyomatous uteri.                               3. History of complex hyperplasia without                                  atypia.                               4. Dysmenorrhea.  POSTOPERATIVE DIAGNOSES:      1. Menometrorrhagia.                               2. Leiomyomatous uteri.                               3. History of complex hyperplasia without                                  atypia.                               4. Dysmenorrhea.  PROCEDURE PERFORMED:          Total abdominal hysterectomy with ovarian conservation and pelvic adhesiolysis.  SURGEON:                      Juan H. Lily Peer, M.D.  FIRST ASSISTANT:              Timothy P. Fontaine, M.D.  ANESTHESIA:                   General endotracheal anesthesia.  INDICATIONS FOR OPERATION:    The patient is a 50 year old gravida 0 with a long-standing history of menometrorrhagia, dysmenorrhea, fibroid uterus and recently-treated with progestational agent for complex hyperplasia without atypia.  Follow-up endometrial biopsy after treatment with normal endometrial cavity.  FINDINGS:                     1. Pelvic adhesions.                               2. Leiomyomatous uteri.                               3. Normal tubes and  ovaries.  DESCRIPTION OF OPERATION:     After the patient was adequately counseled, she was taken to the operating room, where she underwent successful general endotracheal anesthesia.  The patient received 1 g of Cefotan preoperatively and, for DVT prophylaxis, she had  pneumatic compression stockings.  After general endotracheal anesthesia was obtained, the patient was in the supine position.  The abdomen, vagina and perineum were prepped and draped in the usual sterile fashion.  A Foley catheter was inserted in an effort to monitor urinary output.  A Pfannenstiel skin incision was made slightly above the abdominal crease due to the patients obesity.  The incision was carried down through the skin and subcutaneous tissue down to the rectus fascia.  An extensive amount of adipose tissue was evident.  The fascia was then incised in a transverse fashion.  The midline raphe was entered.  The peritoneal cavity was entered cautiously.  The patient was placed in slight Trendelenburg and the bowel was packed in a cephalic direction and placement of OConnor-OSullivan retractors.  After adhesiolysis of the pelvic adhesions to free the uterus, it was noted that the fibroid was approximately 8-10 weeks in size and went from side to side in the pelvic cavity.  The first part of the operation consisted of doing a myomectomy to remove the myoma in an effort to gain exposure to complete the hysterectomy.  This was accomplished with electrical Bovie along with blunt dissection.  Once the large, 10 cm fibroid was removed, attention was then placed to the right round ligament, which was identified and suture ligated at its distal portion with 0 Vicryl suture.  The round ligament was then transected.  A bladder flap was established by incising the anterior broad ligament down to the level of the internal cervical os.  The right tube and ovary were normal in appearance.  The posterior broad ligament was penetrated with the surgeons finger, staying close to the uterus, and the left tube and utero-ovarian ligament were clamped, cut and suture ligated with 0 Vicryl suture.  The right infundibulopelvic ligament was secured with 0 Vicryl suture in a transfixation stitch, leaving  the right tube and ovary behind.  The remainder of the parametrium was skeletonized and the remainder of the broad and cardinal ligaments were serially clamped, cut and suture ligated to the level of the uterine artery.  A similar procedure was carried out on the contralateral side.  The uterus was then amputated and passed off the operative field.  The remainder of the cervix down to the cardinal ligament and the right and left vaginal fornices were clamped, cut and suture ligated.  The angles were secured.  The remaining cervix was excised and passed off the operative field. Once the angles were secured and tied, the remaining vaginal cuff was approximated with interrupted sutures of 0 Vicryl suture.  The pelvic cavity was copiously irrigated with normal saline solution.  After a systematic inspection of both right and infundibulopelvic ligaments and the vaginal cuff demonstrated adequate hemostasis, the sponges were removed.  Sponge count and needle count were correct.  The OConnor-OSullivan retractor was removed. The rectus fascia was closed with a running stitch of 0 Vicryl suture.  Due to the amount of adipose tissue present and to prevent postoperative bleeding and to maintain adequate drainage, a #10 size Jackson-Pratt drain was placed with separate exit lateral to the incision for drainage.  This was secured to the skin with #4 Ethibond suture.  The subcutaneous bleeders were  Bovie cauterized and the skin was reapproximated with skin clips, followed by placement of Xeroform gauze and 4 x dressing.  The patient was extubated and transferred to the recovery room with stable vital signs.  She received 30 mg of Toradol enroute to the recovery room.  Blood loss was 700 cc.  Fluid resuscitation consisted of 2800 cc of lactated Ringers.  Urine output was 700 cc. Dictated by:   [name] Lily Peer, M.D. Attending Physician:  Tonye Royalty DD:  10/28/00 TD:  10/29/00 Job:  [id number]

## 2010-05-24 NOTE — Op Note (Signed)
[facility]  Patient:    [name], [name]                        MRN: [id number] Proc. Date: 04/17/00 Attending:  [name]. Lily Peer, M.D.                           Operative Report  INDICATION:                   A 50 year old gravida 0 with menorrhagia. Preoperative sonohysterogram demonstrated intracavitary endometrial polyps.  PREOPERATIVE DIAGNOSES:       1. Dysfunctional uterine bleeding.                               2. Endometrial polyps.  POSTOPERATIVE DIAGNOSES:      1. Dysfunctional uterine bleeding.                               2. Endometrial polyps.  PROCEDURE:                    1. Resectoscopic polypectomy.                               2. Suction curettage.  FINDINGS:                     Multiple intrauterine polyps.  SURGEON:                      Juan H. Lily Peer, M.D.  ANESTHESIA:                   General endotracheal.  DESCRIPTION OF PROCEDURE:     After the patient was adequately counseled, she was taken to the operating room where she underwent general endotracheal anesthesia. She had received a gram of Cefotan preoperatively. She was placed in low lithotomy position. A laminaria that was previously placed intracervically in the office the day before was removed, thus requiring no dilatation of her endocervical canal. A single-tooth tenaculum was placed on the anterior cervical lip prior to this. An examination under anesthesia demonstrated an anteverted uterus with no palpable adnexal masses and a red rubber Roxan Hockey was inserted to evacuate bladder contents for approximately 50 cc. After the tenaculum was in place, the operative resectoscope with a 90 degree wire loop was inserted into the intrauterine cavity and in systematic fashion individual polyps were excised and passed from the operative field for histologic evaluation. Bleeding that occurred was cauterized individually with a wire loop and a ball electrode. The  [employer] electrical surgical generator was set at 80 watts on the cutting mode, 80 watts on the coagulation mode. Sorbitol 3% was the distending media and fluid deficit was recorded at 80 cc. For additional tissue sampling, beside blunt curettage, suction curettage was utilized and this was sent off as a separate specimen for histologic evaluation. The single-tooth tenaculum was removed, the patient was awakened, transferred to recovery room with stable vital signs. She was given 30 mg of Toradol en route to the recovery room. For postoperative hemostasis while in the recovery room for two hours, a 30 cc Foley catheter was inserted into the intrauterine cavity for additional tamponade effect. The  patient tolerated the procedure well with no complications. DD:  04/17/00 TD:  04/17/00 Job: [id number]

## 2010-05-24 NOTE — H&P (Signed)
[facility]  Patient:    [name], [name]                        MRN: [id number] Adm. Date:  04/17/00 Attending:  [name]. Lily Peer, M.D.                         History and Physical  CHIEF COMPLAINT:  Menorrhagia/endometrial polyp.  HISTORY OF PRESENT ILLNESS:  The patient is a 50 year old, gravida 0 who was seen in our office as a new patient on February 14, 2000 complaining of having history in the past of chronic inovulation and appeared to have PCO for which she was treated with ovarian drilling in her native country of [place]. She is also obese and has increased hirsutism and recently was complaining of increased prolonged heavy periods. She was at increased for endometrial cancer and was begun to be evaluated by having seen the patient on March 04, 2000 for a planned sonohysterogram and endometrial biopsy. The ureters measured 10.7 x 6.8 x 8.2 cm and a fundal posterior myoma measuring 65 x 58 x 76 mm was noted. The patients sonohysterogram demonstrated a 24 x 7 mm posterior wall polypoid like lesion and posterior area of the endometrial cavity was thickened to 14.6 mm. Attempted endometrial biopsy was unsuccessful due to the angulation of the uterus and the discomfort for the patient and it was decided to proceed with endometrial biopsy at the time of the resectoscopic polypectomy/myomectomy in the operating room where she would be more relaxed.  Her Pap smear from February 19, 2000 was normal. Her workup consisting of a TSH, random blood sugar, LH, FSH, DHA, 17-hydroxyprogesterone and total testosterone were all normal. She is not using any form of contraception.  ALLERGIES:  PENICILLIN.  CURRENT MEDICATIONS:  Ibuprofen p.r.n. and vitamin C.  PAST MEDICAL HISTORY: 1. Ovarian drilling in 1995 in [place]. 2. The patient had her first baseline mammogram here on February 27, 1998    which was normal.  FAMILY HISTORY:  Her mother is an insulin  dependent diabetic and father and mother both are hypertensive and an aunt with ovarian cancer and her mother also with history of breast cancer, as well.  PHYSICAL EXAMINATION:  GENERAL:  Obese female with hirsutism in the submandibular region.  HEENT:  Unremarkable.  NECK:  Supple. Trachea midline. No carotid bruit, no thyromegaly.  LUNGS:  Clear to auscultation without rales, rhonchi or wheezes.  CARDIOVASCULAR:  Regular rate and rhythm, no murmurs, rubs, or gallops.  BREASTS:  Was carried out in the sitting and supine positions. They were symmetrical in appearance. No skin discoloration, no nipple inversion, no palpable mass or tenderness, no supraclavicular or axillary lymphadenopathy.  ABDOMEN:  Soft and nontender without rebound or guarding.  PELVIC:  Bartholin, urethral and Skenes glands were within normal limits. Vagina no gross lesions on inspection. Cervix slightly anterior. Uterus and adnexa difficult to evaluate secondary to patients obesity.  RECTAL:  Otherwise unremarkable.  ASSESSMENT:  50 year old gravida 0 with hirsutism and normal lab studies, normal testosterone with prior history of chronic inovulation. Suspect PCO based on her morphology and previously being treated with ovarian drilling in [place], had now complained of menorrhagia and workup demonstrated an endometrial polyp/submucous myoma in the posterior aspect of the uterine wall. The patient was seen in the office on April 16, 2000 where she was counseled as to the risks and benefits and  pros and cons of the operation to include infection, bleeding, trauma to internal organs, perforation of the uterus with instrumentation, fluid overload to include pulmonary edema and also hyponatremia, as well, were all discussed with the patient. She will receive prophylaxis antibiotic in the event of any of those complications. The patient is fully aware that she may need an open laparotomy for corrective  surgery at that point. A Laminaria was placed intracervically today to facilitate the insertion of the instruments tomorrow. The patient was provided a note for work that she will be out for approximately ten days to recover and she had previously been given a prescription for Percocet to take 1-2 tablets p.o. 4-6 hours p.r.n. pain after her surgery. We will proceed with a resectoscopic polypectomy/myomectomy and endometrial biopsy tomorrow, April 17, 2000 at [facility] DD:  04/16/00 TD:  04/16/00 Job: [id number]

## 2010-05-24 NOTE — Op Note (Signed)
[facility]  Patient:    [name], [name] Visit Number: [id number] MRN: [id number]          Service Type: SUR Location: 4W 0456 02 Attending Physician:  Charlton Haws Dictated by:   Currie Paris, M.D. Proc. Date: 11/13/00 Admit Date:  11/12/2000   CC:         Juan H. Lily Peer, M.D.                           Operative Report  VISIT:  [id number]  PREOPERATIVE DIAGNOSIS:  Necrotizing wound infection, abdominal incision status post debridement.  POSTOPERATIVE DIAGNOSIS:  Necrotizing wound infection, abdominal incision status post debridement.  OPERATION/PROCEDURE:  Dressing change under anesthesia with additional wound debridement.  SURGEON:  Currie Paris, M.D.  ANESTHESIA:  General.  CLINICAL HISTORY:  This patient is a 50 year old who developed a severe wound infection of her panniculus just above a Pfannenstiel incision which was drained in the operating room yesterday with debridement of skin, subcutaneous tissues, and packing.  She was stable postoperatively overnight and we would like to take her back today for repeat irrigation and debridement.  DESCRIPTION OF PROCEDURE:  The patient was brought to the operating room and after satisfactory general endotracheal anesthesia ______ dressing was removed and the abdomen prepped and draped.  There were a little bit of residual skin edges that were necrotic which were excised and debrided.  There was also a little bit of necrotic fat tissues in both the superior and inferior flaps which were likewise debrided; and a little bit out of the left lateral flank flap, but this was really minimal findings compared to yesterday.  There is no frank pus and no evidence of any drainage from intraperitoneal to suggest that there was a fistula that had developed.  Once the wound had been debrided I then used the hydroirrigator to irrigate and get some more debris and fragments off of the  wound edges.  At the end of that, this looked quite clean with only a few areas that appeared to be still stuck.  There was minimal bleeding.  The wound was then packed again with some saline moistened Kerlix, using two of these, and then sterile dressings on the surface.  The patient tolerated the procedure well.  There are no operative complications and all counts are correct. Dictated by:   Currie Paris, M.D. Attending Physician:  Charlton Haws DD:  11/13/00 TD:  11/16/00 Job: [id number]

## 2010-05-24 NOTE — Discharge Summary (Signed)
[facility]  Patient:    [name], [name] Visit Number: [id number] MRN: [id number]          Service Type: SUR Location: 4W 0456 02 Attending Physician:  Charlton Haws Dictated by:   Antony Contras, [facility] Admit Date:  11/12/2000 Disc. Date: 10/30/00                             Discharge Summary  DISCHARGE DIAGNOSES:          1. Menometrorrhagia.                               2. Leiomyomata uteri.                               3. History of complex hyperplasia without                                  atypia.                               4. Dysmenorrhea.  PROCEDURES:                   1. Total abdominal hysterectomy with ovarian                                  conservation.                               2. Pelvic adhesiolysis.  HISTORY OF PRESENT ILLNESS:   The patient is a 50 year old nulliparous female who has a history of chronic refractory menorrhagia, complex hyperplasia without atypia, pelvic pain, and also evidence of a large fundal fibroid measuring 8.8 x 8.4 x 8.0.  She is anxious to go ahead and proceed with definitive surgery such as total abdominal hysterectomy with ovarian conservation.  She is totally aware of the fact that she will not be able to have any children and is okay with this.  ALLERGIES:                    PENICILLIN.  PAST MEDICAL HISTORY:         1. 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                               2. Resectoscopic polypectomy.                               3. D&C.  HOSPITAL COURSE:  The patient was admitted on October 28, 2000.  A total abdominal hysterectomy, ovarian conservation, pelvic adhesiolysis was performed by Dr. Lily Peer, assisted by Dr. Audie Box, under general anesthesia.  Findings included pelvic adhesions, leiomyomata uterus,  normal tubes and ovaries.  The patient also did have a long-standing history of menometrorrhagia, dysmenorrhea, fibroid uterus, and was recently treated with progesteronal agents for complex hyperplasia without atypia.  Postoperative course:  She did remain afebrile and was able to be discharged in satisfactory condition on her second postoperative day.  CBC:  Hematocrit 34.8, hemoglobin 12, platelets 414.  FOLLOW-UP:                    To follow up for staple removal in the office the following Monday.  MEDICATIONS:                  Over-the-counter Motrin for pain. Dictated by:   Antony Contras, [facility] Attending Physician:  Charlton Haws DD:  11/13/00 TD:  11/15/00 Job: [id number]

## 2010-05-24 NOTE — H&P (Signed)
[facility]  Patient:    [name], [name] Visit Number: [id number] MRN: [id number]          Service Type: SUR Location: 4W 0456 02 Attending Physician:  Charlton Haws Dictated by:   Currie Paris, M.D. Admit Date:  11/12/2000                           History and Physical  VISIT NUMBER:  [id number]  CHIEF COMPLAINT:  Infection.  CLINICAL HISTORY:  Ms. Brisbon was sent to the emergency room from Dr. [name]. Fernandezs office today with possible necrotizing infection of the abdominal wall.  The patient is a 50 year old woman who underwent hysterectomy through a Pfannenstiel incision on October 28, 2000.  She was running some fevers postoperatively.  She was noted to have an elevated white count.  A CT scan had been done which did not show any definite intra-abdominal abscess, although in review the report suggests there may have been some air with some postoperative changes in the wound.  She was seen by [name]. Lily Peer, M.D., two days ago and started on some Keflex.  She returned today with increasing pain and a bad discharge from the area of the wound and skin necrosis.  He felt that there may well be an invasive infection and asked [place] to see her.  PAST MEDICAL HISTORY:  Generally good health.  Details are noted in the recent complete H&P done at [facility].  ALLERGIES:  She is allergic to PENICILLIN, which has caused a rash, but has been able to take Keflex without any trouble.  PHYSICAL EXAMINATION:  The patient is an overweight, but not toxic or sick-appearing patient.  VITAL SIGNS:  Her pulse is 124, blood pressure 125/86, and temperature 99.3 degrees.  HEENT:  Her head is normocephalic.  The eyes are anicteric.  Pupils round and regular.  NECK:  Supple.  No masses or thyromegaly.  LUNGS:  Clear to auscultation.  HEART:  Regular with no murmurs, rubs, or gallops.  ABDOMEN:  Diffusely soft, but there is a  tremendously bad odor coming from the panniculus and she has a long panniculus with what appears to be necrotic skin extending over a width of about 5 cm and for the entire length of the Pfannenstiel incision, although the Pfannenstiel incision is actually tucked under the panniculus and not involved directly with this necrotic tissue. There is a little purulent drainage from the necrotic tissue.  There is surrounding cellulitis that goes about 20 cm in all directions from this necrotic center.  EXTREMITIES:  No cyanosis or edema.  LABORATORY STUDIES:  Elevated white count.  Normal electrolytes.  IMPRESSION:  Wound infection with skin necrosis and panniculitis.  PLAN:  The patient will need to go to the operating room urgently for debridement.  I have discussed this with the patient and her husband and also spoke with [place] H. Lily Peer, M.D.  She will go to the operating room as soon as a room is available. Dictated by:   Currie Paris, M.D. Attending Physician:  Charlton Haws DD:  11/12/00 TD:  11/13/00 Job: [id number]

## 2010-05-24 NOTE — Discharge Summary (Signed)
[name], [name]               ACCOUNT NO.:  [id number]   MEDICAL RECORD NO.:  [id number]          PATIENT TYPE:  INP   LOCATION:  1604                         FACILITY:  [facility]   PHYSICIAN:  Clovis Pu. Cornett, M.D.DATE OF BIRTH:  06-18-1960   DATE OF ADMISSION:  08/13/2005  DATE OF DISCHARGE:  08/17/2005                                 DISCHARGE SUMMARY   ADMISSION DIAGNOSES:  1. Large ventral hernia.  2. Morbid obesity.  3. Hypertension.  4. Type 2 diabetes mellitus.   DISCHARGE DIAGNOSES:  1. Large ventral hernia.  2. Morbid obesity.  3. Hypertension.  4. Type 2 diabetes mellitus.   PROCEDURES PERFORMED:  Open ventral hernia repair.   HISTORY OF PRESENT ILLNESS:  Briefly, the patient is a 50 year old female  with a very large ventral hernia from a previous lower abdominal incision.  She was brought to the operating room for repair of this and admission to  the hospital afterwards.   HOSPITAL COURSE:  Please see operative note for details of the procedure.  The patient's operative course was relatively unremarkable.  Nasogastric  tube was in place for one day due to the large amount of small bowel found  in her panniculus.  She had a very large right lower quadrant hernia and  this was repaired using a combination of mesh and primary closure.  By  postoperative day #2 her NG tube was out and her diet was advanced.  Over  the next two days she began to ambulate more and she wore a binder while in  the hospital.  Her wound showed no signs of infection and she was discharged  home on postoperative day #4 in satisfactory condition.   DISCHARGE INSTRUCTIONS:  She will followup in one to two weeks to have her  staples removed.  She will resume her preoperative medications.  She will  wear her abdominal binder and not do any heavy lifting or driving until I  tell her otherwise.  She will be given Vicodin for pain.   CONDITION ON DISCHARGE:  Satisfactory.      Thomas A.  Cornett, M.D.  Electronically Signed     TAC/MEDQ  D:  09/11/2005  T:  09/11/2005  Job:  [id number]

## 2010-05-24 NOTE — Op Note (Signed)
[facility]  Patient:    [name], [name] Visit Number: [id number] MRN: [id number]          Service Type: SUR Location: 4W 0456 02 Attending Physician:  Charlton Haws Proc. Date: 11/12/00 Admit Date:  11/12/2000   CC:         Juan H. Lily Peer, M.D.   Operative Report  VISIT NUMBER:  [id number]  PREOPERATIVE DIAGNOSIS:  Wound infection with panniculitis and skin necrosis.  POSTOPERATIVE DIAGNOSIS:  Wound infection with panniculitis and skin necrosis.  OPERATION:  Evacuation and debridement of abdominal wall and wound infection.  SURGEON:  Currie Paris, M.D.  ASSISTANT:  [name]. Lily Peer, M.D.  ANESTHESIA:  General endotracheal.  CLINICAL HISTORY:  This patient is a 50 year old woman, who has presented with what appears to a necrotizing wound infection approximately two weeks status post open hysterectomy.  DESCRIPTION OF PROCEDURE:  The patient was brought to the operating room and after satisfactory general endotracheal anesthesia had been obtained, the entire abdomen was prepped and draped as a single sterile field.  There was a Pfannenstiel incision which was tucked under a panniculus.  The most dependent portion of the panniculus was necrotic its entire length with a width of about 5-7 cm.  There was bolus changes and blackish-looking fluid draining from the boli.  There was what appeared to be purulent material draining from the middle portion of the wound of the necrotic skin.  After prepping, I made a skin incision to excise the entire area of necrotic skin.  There was a large collection of pus subcutaneously, and the full-thickness skin was excised.  There was found, as noted, the entire wound down to the fascia was involved with infection with purulent material, necrotic fat tissue, etc.  This was all initially manually suctioned and debrided out.  Following that, the suction irrigator was used to debride  the remaining fatty tissue to get it as clean as possible.  There was essentially no bleeding during this, as most of what we were working on was necrotic tissue.  There did not appear to be an anaerobic invasive infection per se. The fascia appeared interrupted at the base and I think had fallen apart, and we did find some loose, what appeared to be, nonpigmented Vicryl or absorbable suture.  The peritoneum appeared intact, and I did not want to go any further for fear of spreading this infection intraperitoneally, as I think this was confined to the fascia and extrafascial tissues.  Once everything had been debrided as much as possible, I irrigated, then I tacked it with some moistened Kerlix sponges using two to fill up the entire area.  Sterile dressings were applied.  The patient tolerated the procedure well.  There were no operative complications, and all counts were correct at the end. Attending Physician:  Charlton Haws DD:  11/12/00 TD:  11/14/00 Job: [id number]

## 2010-05-24 NOTE — Op Note (Signed)
[facility]  Patient:    [name], [name] Visit Number: [id number] MRN: [id number]          Service Type: SUR Location: 4W 0456 02 Attending Physician:  Charlton Haws Dictated by:   Currie Paris, M.D. Proc. Date: 11/18/00 Admit Date:  11/12/2000   CC:         Juan H. Lily Peer, M.D.   Operative Report  CCS NUMBER:  [id number]  PREOPERATIVE DIAGNOSIS:  Necrotizing wound infection with additional skin necrosis status post drainage.  POSTOPERATIVE DIAGNOSIS:  Necrotizing wound infection with additional skin necrosis status post drainage.  OPERATION:  Debridement and dressing change abdominal wound.  SURGEON:  Currie Paris, M.D.  ANESTHESIA: General.  INDICATIONS:  This patient is a 50 year old woman developed a large wound infection of her panniculus that involved most of the panniculus and was debrided twice previously.  It has been cleaning up under local wound care but under the lateral ends on each was what appeared to be necrotic fat, and she developed an area of skin necrosis on the right side and impending skin necrosis on the left side.  I elected to bring her back to redebride the wound and open it up a little further and open it up a little further and do the dressing change under anesthesia.  DESCRIPTION OF PROCEDURE:  The patient was brought to the operating room and satisfactory general anesthesia obtained.  The old dressing was removed, and packing pulled out.  The area around it was prepped with some Betadine.  On inspecting the wound, most of the area previously opened was clean and developing some granulation tissues although a few areas of necrotic fat tissue were dissected out.  I extended the incision laterally to the right side, and to include the area that had developed some skin necrosis and excised the necrotic skin there.  There was also necrotic fatty tissue going down, and this was also debrided out  and cleaned up.  Similarly, I opened it on the left side into the area that was developing some ischemic skin changes and found more evidence of necrotic fat underneath and I think probably the blood supply here just becomes ischemic of the necrotic fat underneath and ongoing infection although there is not a large pus pocket.  Again, this tissue was debrided and cleaned up.  Bleeders were controlled with the cautery.  Once everything was dry I repacked this with two Kerlix sponges moistened with saline and sterile dressings externally.  The patient tolerated the procedure well. Dictated by:   Currie Paris, M.D. Attending Physician:  Charlton Haws DD:  11/18/00 TD:  11/18/00 Job: [id number]

## 2010-05-24 NOTE — H&P (Signed)
[facility]  Patient:    [name], [name] Visit Number: [id number] MRN: [id number]          Service Type: Attending:  [name]. Lily Peer, M.D. Dictated by:   [name] Lily Peer, M.D. Adm. Date:  10/28/00                           History and Physical  CHIEF COMPLAINT:              Pelvic pains, menometrorrhagia, and fibroid uterus.  HISTORY OF PRESENT ILLNESS:   The patient is a 50 year old gravida 0, who on April 17, 2000, had undergone a resectoscopic polypectomy, suction curettage secondary to dysfunctional uterine bleeding.  Her pathology report demonstrated proliferative endometrium associated with foci of complex hyperplasia without atypia.  The patient was put on Provera 10 mg for 10 days each month and returned to the office for follow-up biopsy.  She had an endometrial biopsy on August 14, 2000, and demonstrated no hyperplasia or malignancy.  The ultrasound on March 04, 2000, as well as the one in August, demonstrates she had a fundal fibroid measuring 8.8 x 8.4 cm.  She had an endometrial biopsy on August 14, 2000, and also an ultrasound demonstrating endometrial stripe of 14.9 mm.  A questionable echogenic defect was seen at the posterior wall.  At that point she was going to be placed on Megace 20 mg p.o. for 30 days and follow for a repeat biopsy in 30 days.  She could not tolerate the Megace and was placed back on Provera.  She returned back on September 18, 2000.  Her endometrial stripe had decreased to 8.8.  Uterine posterior fundus was still the same and unchanged.  The ovaries were normal. Saline infusion sonohistogram demonstrated an echogenic soft-tissue defect measuring 8.8 x 4 mm, likely a polyp in the cavity.  The remainder of endometrial walls appear thin, smoothly marginated.  The patient had stated her last period when she had taken the Provera had lasted almost 12 days.  She was anxious and wanted to have definitive surgery.   Having no children was no longer an issue, and she wanted to proceed with a total abdominal hysterectomy with ovarian conservation.  She was provided with literature information from the [employer] [facility].  ALLERGIES:                    PENICILLIN.  CURRENT MEDICATIONS:          Ibuprofen, vitamin C, and Provera which she takes 10 mg p.o. q.d.  PAST MEDICAL HISTORY:         1. Some form of ovarian drilling in 1995 in [place]                                  for ovarian cyst or infertility.  History is                                  sketchy.                               2. Resectoscopic polypectomy, D&C early this  year.  FAMILY HISTORY:               Mother is insulin-dependent diabetic.  Father and mother both are hypertensive.  An aunt with ovarian cancer.  Mother also with history of breast cancer as well.  PHYSICAL EXAMINATION:  GENERAL:                      Obese female.  Slightly hirsute in the submandibular region.  Of note, she has had a TSH, FSH for lack of testosterone, DHEAS, 17-hydroxyprogesterone TSH, which were all normal.  HEENT:                        Unremarkable.  NECK:                         Supple.  Trachea is midline.  No carotid bruits, no thyromegaly.  LUNGS:                        Clear to auscultation.  Without rhonchi or wheezes.  HEART:                        Regular rate and rhythm.  No murmurs or gallops.  BREASTS:                      Not done recently.  ABDOMEN:                      Soft and nontender.  Without rebound or guarding.  PELVIC:                       Bartholin, urethral, and Skenes glands within normal limits.  Vagina, no gross lesions on inspection.  Cervix slightly anterior.  Uterus, adnexa difficult to evaluate secondary to the patients obesity.  RECTAL:                       Otherwise unremarkable.  ASSESSMENT:                   [age]-year-old female with chronic refractory  menorrhagia, history of complex hyperplasia without atypia. Complaining of also of pelvic pains as well as also with evidence of a large fundal fibroid measuring 8.8 x 8.4 x 8.0 cm.  Patient anxious to go ahead and proceed with definitive surgery such as a total abdominal hysterectomy with ovarian conservation.  The patient is fully aware that she will not be able to have any children.  She and her husband are content with this.  She wants her quality of life to improve.  She knows that due to the fact that she is 50 years of age, has had history of irregular periods all her life, has recently had complex hyperplasia although with Provera the complex hyperplasia has resolved, but she continues to have menorrhagia and cramping, and her being overweight, it places her at increased risk for endometrial hyperplasia in the future as well as cancer.  Decided then to proceed with total abdominal hysterectomy.  This approach was selected due to the fact that this fibroid is large and with the amount of pelvic pain she has had and also due to the fact that some time of surgery abdominally into her pelvic region was done in [place], of which we have  no documentation, and this was considered to be a safer route to proceed with.  The patient had been provided with literature information from the [employer] [facility] outlining risks, benefits, pros, and cons of the operation to include infection, although she will receive prophylaxis antibiotic, also the risk of hemorrhage requiring blood transfusion with its potential risks of anaphylactic reaction, hepatitis, and AIDS.  Also, in the event of any technical difficulty or trauma to the internal organs such as the bladder, intestines, blood vessels, nerves, would need corrective surgery at the time of the surgery.  Also, in the event that there is any trauma to her bladder, she knows that she may need to go home with a leg bag to allow healing of the  bladder.  All these issues were discussed with the patient and her husband in Spanish, and literature information from the [employer]  of Ob/[facility] outlining abdominal hysterectomy has been presented to the patient. She and her husband are fully aware that she will no longer be able to have children after this procedure is completed.  All questions were answered, and will follow accordingly.  PLAN:                         The patient is scheduled for total abdominal hysterectomy with ovarian conservation on Wednesday, October 28, 2000, at 7:30 a.m. at [facility]. Dictated by:   [name]. Lily Peer, M.D. Attending:  [name]. Lily Peer, M.D. DD:  10/23/00 TD:  10/23/00 Job: [id number]

## 2012-10-11 ENCOUNTER — Ambulatory Visit: Payer: BC Managed Care – PPO

## 2012-10-11 ENCOUNTER — Encounter (HOSPITAL_COMMUNITY): Payer: Self-pay

## 2012-10-11 ENCOUNTER — Ambulatory Visit (HOSPITAL_COMMUNITY)
Admission: RE | Admit: 2012-10-11 | Discharge: 2012-10-11 | Disposition: A | Discharge status: Home / Self Care | Payer: BC Managed Care – PPO | Source: Ambulatory Visit | Attending: Family Medicine | Admitting: Family Medicine

## 2012-10-11 ENCOUNTER — Telehealth: Payer: Self-pay

## 2012-10-11 ENCOUNTER — Ambulatory Visit (INDEPENDENT_AMBULATORY_CARE_PROVIDER_SITE_OTHER): Payer: BC Managed Care – PPO | Admitting: Family Medicine

## 2012-10-11 VITALS — BP 124/78 | HR 106 | Temp 98.7°F | Resp 18 | Wt 270.0 lb

## 2012-10-11 DIAGNOSIS — R0602 Shortness of breath: Secondary | ICD-10-CM | POA: Insufficient documentation

## 2012-10-11 DIAGNOSIS — R509 Fever, unspecified: Secondary | ICD-10-CM

## 2012-10-11 DIAGNOSIS — D739 Disease of spleen, unspecified: Secondary | ICD-10-CM | POA: Insufficient documentation

## 2012-10-11 DIAGNOSIS — R0902 Hypoxemia: Secondary | ICD-10-CM

## 2012-10-11 DIAGNOSIS — R05 Cough: Secondary | ICD-10-CM | POA: Insufficient documentation

## 2012-10-11 DIAGNOSIS — R062 Wheezing: Secondary | ICD-10-CM | POA: Insufficient documentation

## 2012-10-11 DIAGNOSIS — R918 Other nonspecific abnormal finding of lung field: Secondary | ICD-10-CM | POA: Insufficient documentation

## 2012-10-11 DIAGNOSIS — R059 Cough, unspecified: Secondary | ICD-10-CM | POA: Insufficient documentation

## 2012-10-11 DIAGNOSIS — J209 Acute bronchitis, unspecified: Secondary | ICD-10-CM

## 2012-10-11 LAB — POCT CBC
Hemoglobin: 12.7 g/dL (ref 12.2–16.2)
Lymph, poc: 3.9 — AB (ref 0.6–3.4)
MCHC: 30.4 g/dL — AB (ref 31.8–35.4)
MID (cbc): 1 — AB (ref 0–0.9)
MPV: 8.7 fL (ref 0–99.8)
POC MID %: 7.8 %M (ref 0–12)
Platelet Count, POC: 382 10*3/uL (ref 142–424)
RBC: 4.49 M/uL (ref 4.04–5.48)

## 2012-10-11 LAB — D-DIMER, QUANTITATIVE: D-Dimer, Quant: 0.61 ug/mL-FEU — ABNORMAL HIGH (ref 0.00–0.48)

## 2012-10-11 MED ORDER — ALBUTEROL SULFATE HFA 108 (90 BASE) MCG/ACT IN AERS: 2.0000 | INHALATION_SPRAY | Freq: Four times a day (QID) | RESPIRATORY_TRACT | Status: DC | PRN

## 2012-10-11 MED ORDER — DOXYCYCLINE HYCLATE 100 MG PO TABS: 100.0000 mg | ORAL_TABLET | Freq: Two times a day (BID) | ORAL | Status: DC

## 2012-10-11 MED ORDER — IOHEXOL 350 MG/ML SOLN
100.0000 mL | Freq: Once | INTRAVENOUS | Status: AC | PRN
  Administered 2012-10-11: 100 mL via INTRAVENOUS

## 2012-10-11 MED ORDER — ALBUTEROL SULFATE (2.5 MG/3ML) 0.083% IN NEBU
2.5000 mg | INHALATION_SOLUTION | Freq: Once | RESPIRATORY_TRACT | Status: AC
  Administered 2012-10-11: 2.5 mg via RESPIRATORY_TRACT

## 2012-10-11 NOTE — Telephone Encounter (Signed)
I called her - thanks!

## 2012-10-11 NOTE — Progress Notes (Signed)
[facility] [address], [place] [address] [phone]  Date:  10/11/2012   Name:  ELLISHA Russell   DOB:  11-11-1960   MRN:  [id number]  PCP:  Pcp Not In System    Chief Complaint: Cough, Fever and Wheezing   History of Present Illness:  [name] is a 52 y.o. very pleasant female patient who presents with the following:  Here today as a new patient with illness.  She has noted a fever for the last 2 days.  She first got sick 3 days ago.  She has a lot of coughing, wheezing, and it is hard for her to breathe.   She did have a history of asthma, but will use ventolin on occasion.   Her temp was up to 101.   She has also noted a ST.   No GI symptoms.   PCP is with [facility].   She has NIDDM.    She has tried some of her ventolin, and advil as needed.  Last used ventolin last night  She did have a DVT several years ago.  It was treated with aspirin and resolved.  No current calf pain or swelling, no recent travel    Patient Active Problem List   Diagnosis Date Noted  . URI 01/13/2007  . DIABETES MELLITUS, TYPE II 06/05/2006  . HYPERLIPIDEMIA 06/05/2006  . HYPERTENSION 06/05/2006  . ASTHMA 06/05/2006    Past Medical History  Diagnosis Date  . Asthma   . Diabetes mellitus without complication   . Hypertension     Past Surgical History  Procedure Laterality Date  . Hernia repair    . Abdominal hysterectomy      History  Substance Use Topics  . Smoking status: Never Smoker   . Smokeless tobacco: Not on file  . Alcohol Use: No    Family History  Problem Relation Age of Onset  . Diabetes Mother   . Cancer Mother   . Asthma Father     Allergies  Allergen Reactions  . Shellfish Allergy Anaphylaxis and Swelling  . Demerol [Meperidine] Hives  . Penicillins Rash    Medication list has been reviewed and updated.  No current outpatient prescriptions on file prior to visit.   No current facility-administered medications on  file prior to visit.    Review of Systems:  As per HPI- otherwise negative.   Physical Examination: Filed Vitals:   10/11/12 1006  BP: 110/76  Pulse: 100  Temp: 98.7 F (37.1 C)  Resp: 18   Filed Vitals:   10/11/12 1006  Weight: 270 lb (122.471 kg)   There is no height on file to calculate BMI. Ideal Body Weight:    GEN: WDWN, NAD, Non-toxic, A & O x 3, obese HEENT: Atraumatic, Normocephalic. Neck supple. No masses, No LAD.  Bilateral TM wnl, oropharynx normal.  PEERL,EOMI.   Ears and Nose: No external deformity. CV: RRR, No M/G/R. No JVD. No thrill. No extra heart sounds. PULM: CTA B, no crackles, no rhonchi. No retractions. No resp. distress. No accessory muscle use.  Mild wheezing bilaterally ABD: S, NT, ND, +BS. No rebound. No HSM. EXTR: No c/c/e NEURO Normal gait.  PSYCH: Normally interactive. Conversant. Not depressed or anxious appearing.  Calm demeanor.   Treated with albuterol neb:  Felt a bit better.  However mild tachycardia and slightly low O2 sat persist  [facility] reading (PRIMARY) by  Dr. Patsy Lager. CXR: normal EXAM: CHEST 2 VIEW  COMPARISON: None.  FINDINGS: The heart size and mediastinal contours are within normal limits. Both lungs are clear. The visualized skeletal structures are unremarkable.  IMPRESSION: No active cardiopulmonary disease.  Results for orders placed in visit on 10/11/12  POCT CBC      Result Value Range   WBC 12.6 (*) 4.6 - 10.2 K/uL   Lymph, poc 3.9 (*) 0.6 - 3.4   POC LYMPH PERCENT 31.2  10 - 50 %L   MID (cbc) 1.0 (*) 0 - 0.9   POC MID % 7.8  0 - 12 %M   POC Granulocyte 7.7+  2 - 6.9   Granulocyte percent 61.0  37 - 80 %G   RBC 4.49  4.04 - 5.48 M/uL   Hemoglobin 12.7  12.2 - 16.2 g/dL   HCT, POC 16.1  09.6 - 47.9 %   MCV 93.0  80 - 97 fL   MCH, POC 28.3  27 - 31.2 pg   MCHC 30.4 (*) 31.8 - 35.4 g/dL   RDW, POC 04.5     Platelet Count, POC 382  142 - 424 K/uL   MPV 8.7  0 - 99.8 fL    Assessment and Plan: Cough -  Plan: DG Chest 2 View, albuterol (PROVENTIL) (2.5 MG/3ML) 0.083% nebulizer solution 2.5 mg  Fever, unspecified - Plan: DG Chest 2 View, POCT CBC  Acute bronchitis - Plan: doxycycline (VIBRA-TABS) 100 MG tablet  SOB (shortness of breath) - Plan: D-dimer, quantitative, albuterol (PROVENTIL HFA;VENTOLIN HFA) 108 (90 BASE) MCG/ACT inhaler  Likely bronchitis and RAD.  However I am concerned about her mild tachycardia and history of DVT.  Discussed doing a D dimer and she would like to proceed with this test.   Signed Abbe Amsterdam, MD  Received D dimer result:  Positive.  Will send for a CT scan today.   Called and let her know that her CT is normal.  Plan to continue with medications as above, follow-up closely.

## 2012-10-11 NOTE — Patient Instructions (Signed)
I will call you with your D dimer test results later today.  Use the doxycycline antibiotic as directed for bronchitis or mild pneumonia.    Assuming your D dimer is negative, please be sure to let me know if not better soon!   I also refilled your ventolin

## 2012-10-11 NOTE — Telephone Encounter (Signed)
Spoke with Saleema and advised her that her CT was negative for PE. After discussing with Lanora Manis, we decided to have Dr Patsy Lager call to go over all the results. I tried to call Dr Copland's cell but no answer. Dr Patsy Lager please advise.

## 2012-10-12 ENCOUNTER — Telehealth: Payer: Self-pay | Admitting: Family Medicine

## 2012-10-12 NOTE — Telephone Encounter (Signed)
Called to check on her.  Also left detailed message regarding her recent CT scan and follow-up recommendations.

## 2012-10-13 ENCOUNTER — Encounter: Payer: Self-pay | Admitting: Family Medicine

## 2012-10-13 ENCOUNTER — Telehealth: Payer: Self-pay | Admitting: Family Medicine

## 2012-10-13 NOTE — Telephone Encounter (Signed)
Called to check on her.  She is feeling some better.  She will see her PCP tomorrow.  Went over the details of her CT- recommend repeat in one year (she has never been a smoker) and possible further evaluation of her spleen.  She would like to discuss these things with her PCP which is reasonable.  Will have her records faxed.  Also sent her a letter with a copy of her recent labs and CT scan

## 2013-05-19 ENCOUNTER — Encounter (HOSPITAL_COMMUNITY): Payer: Self-pay | Admitting: Emergency Medicine

## 2013-05-19 DIAGNOSIS — E119 Type 2 diabetes mellitus without complications: Secondary | ICD-10-CM | POA: Insufficient documentation

## 2013-05-19 DIAGNOSIS — Z79899 Other long term (current) drug therapy: Secondary | ICD-10-CM | POA: Insufficient documentation

## 2013-05-19 DIAGNOSIS — R791 Abnormal coagulation profile: Secondary | ICD-10-CM | POA: Insufficient documentation

## 2013-05-19 DIAGNOSIS — J45909 Unspecified asthma, uncomplicated: Secondary | ICD-10-CM | POA: Insufficient documentation

## 2013-05-19 DIAGNOSIS — I10 Essential (primary) hypertension: Secondary | ICD-10-CM | POA: Insufficient documentation

## 2013-05-19 DIAGNOSIS — R609 Edema, unspecified: Secondary | ICD-10-CM | POA: Insufficient documentation

## 2013-05-19 DIAGNOSIS — Z880 Allergy status to penicillin: Secondary | ICD-10-CM | POA: Insufficient documentation

## 2013-05-19 DIAGNOSIS — Z7982 Long term (current) use of aspirin: Secondary | ICD-10-CM | POA: Insufficient documentation

## 2013-05-19 LAB — CBC WITH DIFFERENTIAL/PLATELET
Basophils Absolute: 0 10*3/uL (ref 0.0–0.1)
Basophils Relative: 0 % (ref 0–1)
EOS ABS: 0.2 10*3/uL (ref 0.0–0.7)
Eosinophils Relative: 2 % (ref 0–5)
HCT: 41.8 % (ref 36.0–46.0)
Hemoglobin: 12.9 g/dL (ref 12.0–15.0)
Lymphocytes Relative: 34 % (ref 12–46)
Lymphs Abs: 3.7 10*3/uL (ref 0.7–4.0)
MCH: 27.7 pg (ref 26.0–34.0)
MCHC: 30.9 g/dL (ref 30.0–36.0)
MCV: 89.7 fL (ref 78.0–100.0)
MONOS PCT: 9 % (ref 3–12)
Monocytes Absolute: 0.9 10*3/uL (ref 0.1–1.0)
Neutro Abs: 6.1 10*3/uL (ref 1.7–7.7)
Neutrophils Relative %: 55 % (ref 43–77)
PLATELETS: 368 10*3/uL (ref 150–400)
RBC: 4.66 MIL/uL (ref 3.87–5.11)
RDW: 16.3 % — ABNORMAL HIGH (ref 11.5–15.5)
WBC: 10.9 10*3/uL — ABNORMAL HIGH (ref 4.0–10.5)

## 2013-05-19 LAB — COMPREHENSIVE METABOLIC PANEL
ALT: 28 U/L (ref 0–35)
AST: 25 U/L (ref 0–37)
Albumin: 3.7 g/dL (ref 3.5–5.2)
Alkaline Phosphatase: 90 U/L (ref 39–117)
BUN: 16 mg/dL (ref 6–23)
CO2: 23 mEq/L (ref 19–32)
CREATININE: 0.8 mg/dL (ref 0.50–1.10)
Calcium: 10.8 mg/dL — ABNORMAL HIGH (ref 8.4–10.5)
Chloride: 97 mEq/L (ref 96–112)
GFR calc non Af Amer: 85 mL/min — ABNORMAL LOW (ref >90)
GLUCOSE: 195 mg/dL — AB (ref 70–99)
Potassium: 5 mEq/L (ref 3.7–5.3)
SODIUM: 137 meq/L (ref 137–147)
TOTAL PROTEIN: 7.7 g/dL (ref 6.0–8.3)
Total Bilirubin: <0.2 mg/dL — ABNORMAL LOW (ref 0.3–1.2)

## 2013-05-19 LAB — PROTIME-INR
INR: 0.88 (ref 0.00–1.49)
PROTHROMBIN TIME: 11.8 s (ref 11.6–15.2)

## 2013-05-19 NOTE — ED Notes (Signed)
Pt was seen by PCP today for c/o ankle swelling and lower leg redness. PCP drew a D-dimer only and reported to pt test was elevated. Pt was told to come to ED for evaluation for blood clot. Pt presents with bilateral lower leg edema, with redness. Legs are not hot to the touch. CMS intact

## 2013-05-20 ENCOUNTER — Emergency Department (HOSPITAL_COMMUNITY)
Admission: EM | Admit: 2013-05-20 | Discharge: 2013-05-20 | Disposition: A | Discharge status: Home / Self Care | Payer: BC Managed Care – PPO | Attending: Emergency Medicine | Admitting: Emergency Medicine

## 2013-05-20 ENCOUNTER — Ambulatory Visit (HOSPITAL_COMMUNITY)
Admission: RE | Admit: 2013-05-20 | Discharge: 2013-05-20 | Disposition: A | Discharge status: Home / Self Care | Payer: BC Managed Care – PPO | Source: Ambulatory Visit | Attending: Emergency Medicine | Admitting: Emergency Medicine

## 2013-05-20 DIAGNOSIS — M7989 Other specified soft tissue disorders: Secondary | ICD-10-CM | POA: Insufficient documentation

## 2013-05-20 DIAGNOSIS — M79609 Pain in unspecified limb: Secondary | ICD-10-CM | POA: Insufficient documentation

## 2013-05-20 DIAGNOSIS — R7989 Other specified abnormal findings of blood chemistry: Secondary | ICD-10-CM

## 2013-05-20 DIAGNOSIS — R600 Localized edema: Secondary | ICD-10-CM

## 2013-05-20 MED ORDER — SULFAMETHOXAZOLE-TMP DS 800-160 MG PO TABS: 1.0000 | ORAL_TABLET | Freq: Every day | ORAL | Status: DC

## 2013-05-20 MED ORDER — SULFAMETHOXAZOLE-TMP DS 800-160 MG PO TABS
1.0000 | ORAL_TABLET | Freq: Once | ORAL | Status: AC
  Administered 2013-05-20: 1 via ORAL
  Filled 2013-05-20: qty 1

## 2013-05-20 NOTE — Discharge Instructions (Signed)
IMPORTANT PATIENT INSTRUCTIONS:  You have been scheduled for an Outpatient Vascular Study at [facility].    If tomorrow is a Saturday or Sunday, please go to the [facility] Emergency Department Registration Desk at 8 am tomorrow morning and tell them you are there for a vascular study.  If tomorrow is a weekday (Monday-Friday), please go to [facility] Admitting Department at 8 am and tell them you are  there for a vascular study.  Take Bactrim as prescribed.  Return to the ER for fevers, increasing redness, pain, or other concerning symptoms.  Follow up with your doctor for recheck of your legs in 1 week.  Wearing compression stockings may help with your lower leg swelling.  This can be purchased at most pharmacies or medical supply stores.   Peripheral Edema You have swelling in your legs (peripheral edema). This swelling is due to excess accumulation of salt and water in your body. Edema may be a sign of heart, kidney or liver disease, or a side effect of a medication. It may also be due to problems in the leg veins. Elevating your legs and using special support stockings may be very helpful, if the cause of the swelling is due to poor venous circulation. Avoid long periods of standing, whatever the cause. Treatment of edema depends on identifying the cause. Chips, pretzels, pickles and other salty foods should be avoided. Restricting salt in your diet is almost always needed. Water pills (diuretics) are often used to remove the excess salt and water from your body via urine. These medicines prevent the kidney from reabsorbing sodium. This increases urine flow. Diuretic treatment may also result in lowering of potassium levels in your body. Potassium supplements may be needed if you have to use diuretics daily. Daily weights can help you keep track of your progress in clearing your edema. You should call your caregiver for follow up care as recommended. SEEK IMMEDIATE MEDICAL CARE IF:   You  have increased swelling, pain, redness, or heat in your legs.  You develop shortness of breath, especially when lying down.  You develop chest or abdominal pain, weakness, or fainting.  You have a fever. Document Released: 01/31/2004 Document Revised: 03/17/2011 Document Reviewed: 01/10/2009 [employer] Patient Information 2014 [place].

## 2013-05-20 NOTE — Progress Notes (Signed)
VASCULAR LAB PRELIMINARY  PRELIMINARY  PRELIMINARY  PRELIMINARY  Left lopwer extremity venous duplex completed.    Preliminary report:  Left leg negative for deep and superficial vein thrombosis.   Margarette Canada, RVT 05/20/2013, 5:46 PM

## 2013-05-20 NOTE — ED Provider Notes (Signed)
CSN: [id number]     Arrival date & time 05/19/13  2110 History   First MD Initiated Contact with Patient 05/20/13 0143     Chief Complaint  Patient presents with  . Abnormal Lab  . Leg Swelling     (Consider location/radiation/quality/duration/timing/severity/associated sxs/prior Treatment) HPI 53 yo female sent to ER from her doctor's office due to elevated ddimer and concern for DVT.  Pt reports she has had bilateral leg swelling for the last 1-2 months.  She was seen at an urgent care about a month ago for redness to her legs and placed on clindamycin for cellulitis.  Pt reports taking all doses, but no change in redness.  Redness has been unchanged for the last month.  Area is always slightly warm.  She had routine f/u with pcm, he drew ddimer.  She denies any asymmetric swelling.  Very mild dull ache to left posterior heel area.  No fevers, chills, n/v/d.  Pt is eating/drinking well.   Past Medical History  Diagnosis Date  . Asthma   . Diabetes mellitus without complication   . Hypertension    Past Surgical History  Procedure Laterality Date  . Hernia repair    . Abdominal hysterectomy     Family History  Problem Relation Age of Onset  . Diabetes Mother   . Cancer Mother   . Asthma Father    History  Substance Use Topics  . Smoking status: Never Smoker   . Smokeless tobacco: Not on file  . Alcohol Use: No   OB History   Grav Para Term Preterm Abortions TAB SAB Ect Mult Living                 Review of Systems  All other systems reviewed and are negative.     Allergies  Shellfish allergy; Demerol; and Penicillins  Home Medications   Prior to Admission medications   Medication Sig Start Date End Date Taking? Authorizing Provider  albuterol (PROVENTIL HFA;VENTOLIN HFA) 108 (90 BASE) MCG/ACT inhaler Inhale 2 puffs into the lungs every 6 (six) hours as needed for wheezing. 10/11/12  Yes Gay Filler Copland, MD  ALPRAZolam (XANAX) 0.25 MG tablet Take 0.25 mg by  mouth 2 (two) times daily as needed for sleep.   Yes Historical Provider, MD  aspirin 81 MG tablet Take 81 mg by mouth daily.   Yes Historical Provider, MD  EPINEPHrine (EPIPEN) 0.3 mg/0.3 mL SOAJ injection Inject 0.3 mg into the muscle daily as needed (allergic reaction).    Yes Historical Provider, MD  Exenatide ER (BYDUREON) 2 MG PEN Inject 1 vial into the skin once a week. ON FRIDAYS   Yes Historical Provider, MD  furosemide (LASIX) 20 MG tablet Take 20 mg by mouth daily.    Yes Historical Provider, MD  glipiZIDE (GLUCOTROL XL) 10 MG 24 hr tablet Take 10 mg by mouth daily.   Yes Historical Provider, MD  hydrocortisone (ANUSOL-HC) 25 MG suppository Place 25 mg rectally at bedtime as needed for hemorrhoids.   Yes Historical Provider, MD  lisinopril (PRINIVIL,ZESTRIL) 20 MG tablet Take 20 mg by mouth daily.   Yes Historical Provider, MD  metFORMIN (GLUCOPHAGE) 1000 MG tablet Take 1,000 mg by mouth 2 (two) times daily with a meal.   Yes Historical Provider, MD  omeprazole (PRILOSEC) 40 MG capsule Take 40 mg by mouth daily.   Yes Historical Provider, MD  simvastatin (ZOCOR) 40 MG tablet Take 40 mg by mouth every evening.   Yes Historical  Provider, MD  sulfamethoxazole-trimethoprim (BACTRIM DS) 800-160 MG per tablet Take 1 tablet by mouth daily. 05/20/13   Kalman Drape, MD   BP 111/56  Pulse 96  Temp(Src) 98.5 F (36.9 C) (Oral)  Resp 22  Ht 5\' 4"  (1.626 m)  Wt 297 lb (134.718 kg)  BMI 50.95 kg/m2  SpO2 95% Physical Exam  Nursing note and vitals reviewed. Constitutional: She is oriented to person, place, and time. She appears well-developed and well-nourished. No distress.  Morbidly obses female, NAD  HENT:  Head: Normocephalic and atraumatic.  Nose: Nose normal.  Mouth/Throat: Oropharynx is clear and moist.  Eyes: Conjunctivae and EOM are normal. Pupils are equal, round, and reactive to light.  Neck: Normal range of motion. Neck supple. No JVD present. No tracheal deviation present. No  thyromegaly present.  Cardiovascular: Normal rate, regular rhythm, normal heart sounds and intact distal pulses.  Exam reveals no gallop and no friction rub.   No murmur heard. Pulmonary/Chest: Effort normal and breath sounds normal. No stridor. No respiratory distress. She has no wheezes. She has no rales. She exhibits no tenderness.  Abdominal: Soft. Bowel sounds are normal. She exhibits no distension and no mass. There is no tenderness. There is no rebound and no guarding.  Musculoskeletal: Normal range of motion. She exhibits edema (edema LE bilaterally, only trace ptting). She exhibits no tenderness.  Lymphadenopathy:    She has no cervical adenopathy.  Neurological: She is alert and oriented to person, place, and time. She has normal reflexes. She exhibits normal muscle tone. Coordination normal.  Skin: Skin is dry. No rash noted. There is erythema (anterior shins bilaterally, blanching mild warmth to touch, nonpainful). No pallor.  Psychiatric: She has a normal mood and affect. Her behavior is normal. Judgment and thought content normal.    ED Course  Procedures (including critical care time) Labs Review Labs Reviewed  CBC WITH DIFFERENTIAL - Abnormal; Notable for the following:    WBC 10.9 (*)    RDW 16.3 (*)    All other components within normal limits  COMPREHENSIVE METABOLIC PANEL - Abnormal; Notable for the following:    Glucose, Bld 195 (*)    Calcium 10.8 (*)    Total Bilirubin <0.2 (*)    GFR calc non Af Amer 85 (*)    All other components within normal limits  PROTIME-INR    Imaging Review No results found.   EKG Interpretation None      MDM   Final diagnoses:  Lower leg edema  Elevated d-dimer    53 year old female sent to the emergency department by her primary care doctor after an elevated D. not dimer was drawn today.  Patient reports one month history of bilateral lower leg swelling with redness to the anterior aspects of both legs.  Patient has  completed 7 days of clindamycin prescribed for possible cellulitis.  She reports no change in the redness, no fevers no chills.  She does report a mild 8 in the left lower leg at the ankle.  Legs are erythematous, but no blistering, no lesions, areas is slightly warm.  I do not feel strongly that this is cellulitis, may be a reactive skin change given her edema.  Given that she is a diabetic, we will place her on Bactrim.  She is return in the morning for a Doppler exam of her left leg for possible DVT.  Again I feel this is unlikely given symmetric swelling.  Patient instructed she needs to follow back  up with her primary care Dr.    Kalman Drape, MD 05/20/13 2055

## 2013-10-04 HISTORY — PX: LAPAROSCOPIC GASTRIC SLEEVE RESECTION: SHX5895

## 2013-10-10 HISTORY — PX: EXPLORATORY LAPAROTOMY: SUR591

## 2014-01-16 ENCOUNTER — Encounter (HOSPITAL_COMMUNITY): Payer: Self-pay | Admitting: Family Medicine

## 2014-01-16 ENCOUNTER — Inpatient Hospital Stay (HOSPITAL_COMMUNITY)
Admission: EM | Admit: 2014-01-16 | Discharge: 2014-01-21 | DRG: 389 | Disposition: A | Discharge status: Home / Self Care | Payer: BLUE CROSS/BLUE SHIELD | Attending: Internal Medicine | Admitting: Internal Medicine

## 2014-01-16 ENCOUNTER — Emergency Department (HOSPITAL_COMMUNITY): Payer: BLUE CROSS/BLUE SHIELD

## 2014-01-16 DIAGNOSIS — R112 Nausea with vomiting, unspecified: Secondary | ICD-10-CM | POA: Diagnosis present

## 2014-01-16 DIAGNOSIS — Z7901 Long term (current) use of anticoagulants: Secondary | ICD-10-CM

## 2014-01-16 DIAGNOSIS — R109 Unspecified abdominal pain: Secondary | ICD-10-CM

## 2014-01-16 DIAGNOSIS — Z880 Allergy status to penicillin: Secondary | ICD-10-CM

## 2014-01-16 DIAGNOSIS — K56600 Partial intestinal obstruction, unspecified as to cause: Secondary | ICD-10-CM | POA: Diagnosis present

## 2014-01-16 DIAGNOSIS — K9189 Other postprocedural complications and disorders of digestive system: Secondary | ICD-10-CM

## 2014-01-16 DIAGNOSIS — D72829 Elevated white blood cell count, unspecified: Secondary | ICD-10-CM | POA: Diagnosis present

## 2014-01-16 DIAGNOSIS — K56609 Unspecified intestinal obstruction, unspecified as to partial versus complete obstruction: Secondary | ICD-10-CM | POA: Diagnosis present

## 2014-01-16 DIAGNOSIS — E860 Dehydration: Secondary | ICD-10-CM | POA: Diagnosis present

## 2014-01-16 DIAGNOSIS — K5669 Other intestinal obstruction: Secondary | ICD-10-CM

## 2014-01-16 DIAGNOSIS — Z6836 Body mass index (BMI) 36.0-36.9, adult: Secondary | ICD-10-CM

## 2014-01-16 DIAGNOSIS — Z859 Personal history of malignant neoplasm, unspecified: Secondary | ICD-10-CM

## 2014-01-16 DIAGNOSIS — R1110 Vomiting, unspecified: Secondary | ICD-10-CM | POA: Diagnosis not present

## 2014-01-16 DIAGNOSIS — E119 Type 2 diabetes mellitus without complications: Secondary | ICD-10-CM

## 2014-01-16 DIAGNOSIS — K9509 Other complications of gastric band procedure: Secondary | ICD-10-CM | POA: Diagnosis present

## 2014-01-16 DIAGNOSIS — N179 Acute kidney failure, unspecified: Secondary | ICD-10-CM | POA: Diagnosis present

## 2014-01-16 DIAGNOSIS — Z809 Family history of malignant neoplasm, unspecified: Secondary | ICD-10-CM

## 2014-01-16 DIAGNOSIS — K550 Acute vascular disorders of intestine: Secondary | ICD-10-CM

## 2014-01-16 DIAGNOSIS — Z90710 Acquired absence of both cervix and uterus: Secondary | ICD-10-CM

## 2014-01-16 DIAGNOSIS — I10 Essential (primary) hypertension: Secondary | ICD-10-CM | POA: Diagnosis present

## 2014-01-16 DIAGNOSIS — E669 Obesity, unspecified: Secondary | ICD-10-CM | POA: Diagnosis present

## 2014-01-16 DIAGNOSIS — Z0189 Encounter for other specified special examinations: Secondary | ICD-10-CM

## 2014-01-16 DIAGNOSIS — K55069 Acute infarction of intestine, part and extent unspecified: Secondary | ICD-10-CM | POA: Diagnosis present

## 2014-01-16 HISTORY — DX: Partial intestinal obstruction, unspecified as to cause: K56.600

## 2014-01-16 HISTORY — DX: Malignant (primary) neoplasm, unspecified: C80.1

## 2014-01-16 LAB — URINE MICROSCOPIC-ADD ON

## 2014-01-16 LAB — URINALYSIS, ROUTINE W REFLEX MICROSCOPIC
Glucose, UA: NEGATIVE mg/dL
Hgb urine dipstick: NEGATIVE
Ketones, ur: >80 mg/dL — AB
LEUKOCYTES UA: NEGATIVE
Nitrite: NEGATIVE
Protein, ur: 30 mg/dL — AB
SPECIFIC GRAVITY, URINE: 1.025 (ref 1.005–1.030)
Urobilinogen, UA: 1 mg/dL (ref 0.0–1.0)
pH: 5 (ref 5.0–8.0)

## 2014-01-16 LAB — COMPREHENSIVE METABOLIC PANEL
ALT: 23 U/L (ref 0–35)
AST: 25 U/L (ref 0–37)
Albumin: 3.9 g/dL (ref 3.5–5.2)
Alkaline Phosphatase: 108 U/L (ref 39–117)
Anion gap: 12 (ref 5–15)
BUN: 23 mg/dL (ref 6–23)
CO2: 20 mmol/L (ref 19–32)
CREATININE: 1.84 mg/dL — AB (ref 0.50–1.10)
Calcium: 10.6 mg/dL — ABNORMAL HIGH (ref 8.4–10.5)
Chloride: 100 mEq/L (ref 96–112)
GFR, EST AFRICAN AMERICAN: 36 mL/min — AB (ref >90)
GFR, EST NON AFRICAN AMERICAN: 31 mL/min — AB (ref >90)
Glucose, Bld: 113 mg/dL — ABNORMAL HIGH (ref 70–99)
Potassium: 4.7 mmol/L (ref 3.5–5.1)
Sodium: 132 mmol/L — ABNORMAL LOW (ref 135–145)
Total Bilirubin: 1.2 mg/dL (ref 0.3–1.2)
Total Protein: 7.9 g/dL (ref 6.0–8.3)

## 2014-01-16 LAB — CBC
HEMATOCRIT: 44.2 % (ref 36.0–46.0)
HEMOGLOBIN: 14.3 g/dL (ref 12.0–15.0)
MCH: 27 pg (ref 26.0–34.0)
MCHC: 32.4 g/dL (ref 30.0–36.0)
MCV: 83.6 fL (ref 78.0–100.0)
Platelets: 405 10*3/uL — ABNORMAL HIGH (ref 150–400)
RBC: 5.29 MIL/uL — AB (ref 3.87–5.11)
RDW: 19.4 % — AB (ref 11.5–15.5)
WBC: 17.7 10*3/uL — ABNORMAL HIGH (ref 4.0–10.5)

## 2014-01-16 LAB — PROTIME-INR
INR: 2.33 — ABNORMAL HIGH (ref 0.00–1.49)
Prothrombin Time: 25.8 seconds — ABNORMAL HIGH (ref 11.6–15.2)

## 2014-01-16 LAB — LIPASE, BLOOD: LIPASE: 35 U/L (ref 11–59)

## 2014-01-16 MED ORDER — SODIUM CHLORIDE 0.9 % IV SOLN
INTRAVENOUS | Status: DC
  Administered 2014-01-16: 17:00:00 via INTRAVENOUS

## 2014-01-16 MED ORDER — WARFARIN - PHYSICIAN DOSING INPATIENT: Freq: Every day | Status: DC

## 2014-01-16 MED ORDER — MORPHINE SULFATE 2 MG/ML IJ SOLN
1.0000 mg | INTRAMUSCULAR | Status: DC | PRN
  Filled 2014-01-16: qty 1

## 2014-01-16 MED ORDER — PANTOPRAZOLE SODIUM 40 MG IV SOLR
40.0000 mg | Freq: Every day | INTRAVENOUS | Status: DC
  Administered 2014-01-17 – 2014-01-20 (×5): 40 mg via INTRAVENOUS
  Filled 2014-01-16 (×7): qty 40

## 2014-01-16 MED ORDER — SODIUM CHLORIDE 0.9 % IV BOLUS (SEPSIS)
1000.0000 mL | Freq: Once | INTRAVENOUS | Status: AC
  Administered 2014-01-16: 1000 mL via INTRAVENOUS

## 2014-01-16 MED ORDER — ONDANSETRON HCL 4 MG PO TABS: 4.0000 mg | ORAL_TABLET | Freq: Four times a day (QID) | ORAL | Status: DC | PRN

## 2014-01-16 MED ORDER — ONDANSETRON HCL 4 MG/2ML IJ SOLN
4.0000 mg | Freq: Four times a day (QID) | INTRAMUSCULAR | Status: DC | PRN
  Administered 2014-01-17 – 2014-01-19 (×3): 4 mg via INTRAVENOUS
  Filled 2014-01-16 (×4): qty 2

## 2014-01-16 MED ORDER — ONDANSETRON HCL 4 MG/2ML IJ SOLN
4.0000 mg | Freq: Once | INTRAMUSCULAR | Status: AC
  Administered 2014-01-16: 4 mg via INTRAVENOUS
  Filled 2014-01-16: qty 2

## 2014-01-16 MED ORDER — ALBUTEROL SULFATE (2.5 MG/3ML) 0.083% IN NEBU: 3.0000 mL | INHALATION_SOLUTION | Freq: Four times a day (QID) | RESPIRATORY_TRACT | Status: DC | PRN

## 2014-01-16 MED ORDER — SODIUM CHLORIDE 0.9 % IV SOLN
INTRAVENOUS | Status: DC
  Administered 2014-01-17 – 2014-01-21 (×5): via INTRAVENOUS

## 2014-01-16 MED ORDER — IOHEXOL 300 MG/ML  SOLN
25.0000 mL | INTRAMUSCULAR | Status: AC
  Administered 2014-01-16: 25 mL via ORAL

## 2014-01-16 MED ORDER — WARFARIN SODIUM 5 MG PO TABS
5.0000 mg | ORAL_TABLET | Freq: Every day | ORAL | Status: DC
  Administered 2014-01-16: 5 mg via ORAL
  Filled 2014-01-16 (×4): qty 1

## 2014-01-16 NOTE — H&P (Signed)
History and Physical  [name] [name] DOB: Jun 01, 1960 DOA: 01/16/2014  Referring physician: Alfonzo Beers, ER physician PCP: Yong Channel, MD   Chief Complaint: Abdominal pain with nausea and vomiting  HPI: [name] is a 54 y.o. female  With past history of obesity, diabetes mellitus and hypertension who underwent a gastric banding in September of this year. Since that time patient has had 2 prior episodes of nausea and vomiting secondary to partial small bowel obstructions, the first evening in November and the last being 2 weeks ago. She started having symptoms of the same and came into the emergency room today. A CT scan of the abdomen and pelvis was done noting a partial small bowel obstruction. ER spoke with patient's gastric surgeon, Dr. [name] of [place] who reported that patient has developed a chronic stricture in her jejunum since November and recommended clear liquids and hydration. He has a follow up appointment with her on Wednesday 1/13. Breast the patient's labs are noted for a leukocytosis of 17, but no evidence of fever or source of infection. Her creatinine was elevated at 1.84 and baseline is believed to be normal. In addition, patient's INR was therapeutic at 2.66-she has a history of mesenteric thrombosis on chronic Coumadin. Hospitalist will call for further evaluation and admission. Patient started on IV fluids   Review of Systems:  Patient seen after arrival to floor. Pt complains of mild nausea, but improved. No vomiting. She does not have bowel movement for several days  Pt denies any headache, vision changes, chest pain, palpitations, shortness of breath, wheeze, cough. GI no longer has any abdominal pain. Denies any hematuria, dysuria, diarrhea, focal extremity numbness or weakness or pain  Review of systems are otherwise negative  Past Medical History  Diagnosis Date  . Asthma   . Diabetes mellitus without complication   . Hypertension   .  Cancer endometrial cancer per patient   obesity Past Surgical History  Procedure Laterality Date  . Hernia repair    . Abdominal hysterectomy     Social History:  reports that she has never smoked. She does not have any smokeless tobacco history on file. She reports that she does not drink alcohol or use illicit drugs. Patient lives at home with her brother & is able to participate in activities of daily living with out assistance  Allergies  Allergen Reactions  . Shellfish Allergy Anaphylaxis and Swelling  . Demerol [Meperidine] Hives  . Phenergan [Promethazine Hcl] Hives  . Penicillins Rash    Family History  Problem Relation Age of Onset  . Diabetes Mother   . Cancer Mother   . Asthma Father       Prior to Admission medications   Medication Sig Start Date End Date Taking? Authorizing Provider  albuterol (PROVENTIL HFA;VENTOLIN HFA) 108 (90 BASE) MCG/ACT inhaler Inhale 2 puffs into the lungs every 6 (six) hours as needed for wheezing. 10/11/12  Yes Gay Filler Copland, MD  cholecalciferol (VITAMIN D) 1000 UNITS tablet Take 1,000 Units by mouth daily.   Yes Historical Provider, MD  EPINEPHrine (EPIPEN) 0.3 mg/0.3 mL SOAJ injection Inject 0.3 mg into the muscle daily as needed (allergic reaction).    Yes Historical Provider, MD  ferrous sulfate 325 (65 FE) MG tablet Take 325 mg by mouth daily with breakfast.   Yes Historical Provider, MD  ondansetron (ZOFRAN-ODT) 4 MG disintegrating tablet Take 4 mg by mouth every 6 (six) hours as needed for nausea or vomiting.   Yes  Historical Provider, MD  polyethylene glycol (MIRALAX / GLYCOLAX) packet Take 17 g by mouth daily.   Yes Historical Provider, MD  warfarin (COUMADIN) 5 MG tablet Take 5 mg by mouth daily.   Yes Historical Provider, MD    Physical Exam: BP 101/66 mmHg  Pulse 90  Temp(Src) 98.7 F (37.1 C) (Oral)  Resp 18  Ht 5\' 5"  (1.651 m)  Wt 98.884 kg (218 lb)  BMI 36.28 kg/m2  SpO2 96%  LMP  (Approximate)  General:  Alert  and oriented 3, no acute distress Eyes: Sclera nonicteric, extract movements are intact ENT: Normocephalic, atraumatic, mucous members are slightly dry Neck: No JVD Cardiovascular: Regular rate and rhythm, S1-S2 Respiratory: Clear to auscultation bilaterally Abdomen: Few bowel sounds Skin: No skin breaks, tears or lesions Musculoskeletal: No clubbing or cyanosis or edema Psychiatric: Patient is appropriate, no evidence of psychoses Neurologic: No focal deficits           Labs on Admission:  Basic Metabolic Panel:  Recent Labs Lab 01/16/14 1129  NA 132*  K 4.7  CL 100  CO2 20  GLUCOSE 113*  BUN 23  CREATININE 1.84*  CALCIUM 10.6*   Liver Function Tests:  Recent Labs Lab 01/16/14 1129  AST 25  ALT 23  ALKPHOS 108  BILITOT 1.2  PROT 7.9  ALBUMIN 3.9    Recent Labs Lab 01/16/14 1129  LIPASE 35   No results for input(s): AMMONIA in the last 168 hours. CBC:  Recent Labs Lab 01/16/14 1129  WBC 17.7*  HGB 14.3  HCT 44.2  MCV 83.6  PLT 405*   Cardiac Enzymes: No results for input(s): CKTOTAL, CKMB, CKMBINDEX, TROPONINI in the last 168 hours.  BNP (last 3 results) No results for input(s): PROBNP in the last 8760 hours. CBG: No results for input(s): GLUCAP in the last 168 hours.  Radiological Exams on Admission: Ct Abdomen Pelvis Wo Contrast  01/16/2014   CLINICAL DATA:  Vomiting.  History of gastric bypass.  EXAM: CT ABDOMEN AND PELVIS WITHOUT CONTRAST  TECHNIQUE: Multidetector CT imaging of the abdomen and pelvis was performed following the standard protocol without IV contrast.  COMPARISON:  11/29/2013 and 10/11/2012 and 10/10/2013  FINDINGS: There is a focal stricture in the proximal jejunum approximately 16 cm beyond the ligament of Treitz. This is in the area of previous inflammation seen on the study of 11/29/2013. There is soft tissue stranding in the adjacent mesenteric.  The small bowel distal to this area is normal. The colon is normal except for  a single diverticulum in the proximal sigmoid region.  There is a 4.5 cm lesion in the inferior aspect of the spleen, essentially unchanged since 10/11/2012. This is most likely a benign lesion.  The liver, biliary tree, pancreas, adrenal glands, and kidneys appear in normal. Ovaries are normal. Uterus has been removed. Bladder is normal. Surgical clips and staples and mesh fixation device is seen in the lower abdomen and pelvis. No acute osseous abnormality.  Moderate hiatal hernia.  IMPRESSION: Partial small bowel obstruction 16 cm beyond the level of the ligament of Treitz.   Electronically Signed   By: Rozetta Nunnery M.D.   On: 01/16/2014 14:13    EKG: Independently reviewed. Sinus tachycardia with R-wave progression, unchanged from previous except faster heart rate  Assessment/Plan Present on Admission:  . nausea/vomiting and Dehydration from small bowel obstruction of the complication of gastric band procedure: Hydrate, nothing by mouth and clear liquids in the morning. Repeat film. If obstruction  resolved, discharge home on clear liquids with follow up with her general surgeon on Wednesday  . Hypertension: Holding antihypertensives.  . Leukocytosis: No signs of infection. Urine and chest x-ray unremarkable. No fever. Likely stress margination, repeat labs in the morning   . ARF (acute renal failure): Secondary to nausea and vomiting. IV fluids and recheck labs in the morning  . Mesenteric thrombosis on chronic anticoagulation. INR therapeutic, continue Coumadin    Consultants: None  Code Status: Full code  Family Communication: Sister at the bedside   Disposition Plan: Likely home tomorrow if small bowel obstruction resolved   Time spent: 34 minutes  [facility] Pager [phone]

## 2014-01-16 NOTE — ED Notes (Signed)
CT called and informed patient finished with contrast.

## 2014-01-16 NOTE — ED Provider Notes (Signed)
CSN: [id number]     Arrival date & time 01/16/14  1324 History   First MD Initiated Contact with Patient 01/16/14 1023     Chief Complaint  Patient presents with  . Emesis     (Consider location/radiation/quality/duration/timing/severity/associated sxs/prior Treatment) HPI  Pt with hx of gastric sleeve surgery done in September 2015 at [facility] presents with c/o vomiting.  Vomiting began yesterday, multiple episodes.  nonbloody and nonbilious.  No abdominal pain.  she states she feels weak.  Has been trying to drink broth and gatorade since early January at the recommendation of her surgeon in [place].  She states that she cannot even keep down these liquids.  Pt states she has had difficulty with vomiting several times since her sugery last fall.  She was seen in November in [facility] and in December in [place] for similar sympotms.   No fever.  No diarrhea associated.  There are no other associated systemic symptoms, there are no other alleviating or modifying factors.   Past Medical History  Diagnosis Date  . Asthma   . Diabetes mellitus without complication   . Hypertension   . Cancer endometrial cancer per patient   Past Surgical History  Procedure Laterality Date  . Hernia repair    . Abdominal hysterectomy     Family History  Problem Relation Age of Onset  . Diabetes Mother   . Cancer Mother   . Asthma Father    History  Substance Use Topics  . Smoking status: Never Smoker   . Smokeless tobacco: Not on file  . Alcohol Use: No   OB History    No data available     Review of Systems  ROS reviewed and all otherwise negative except for mentioned in HPI    Allergies  Shellfish allergy; Demerol; Phenergan; and Penicillins  Home Medications   Prior to Admission medications   Medication Sig Start Date End Date Taking? Authorizing Provider  albuterol (PROVENTIL HFA;VENTOLIN HFA) 108 (90 BASE) MCG/ACT inhaler Inhale 2 puffs into the lungs every 6 (six) hours  as needed for wheezing. 10/11/12  Yes Gay Filler Copland, MD  cholecalciferol (VITAMIN D) 1000 UNITS tablet Take 1,000 Units by mouth daily.   Yes Historical Provider, MD  EPINEPHrine (EPIPEN) 0.3 mg/0.3 mL SOAJ injection Inject 0.3 mg into the muscle daily as needed (allergic reaction).    Yes Historical Provider, MD  ferrous sulfate 325 (65 FE) MG tablet Take 325 mg by mouth daily with breakfast.   Yes Historical Provider, MD  ondansetron (ZOFRAN-ODT) 4 MG disintegrating tablet Take 4 mg by mouth every 6 (six) hours as needed for nausea or vomiting.   Yes Historical Provider, MD  polyethylene glycol (MIRALAX / GLYCOLAX) packet Take 17 g by mouth daily.   Yes Historical Provider, MD  warfarin (COUMADIN) 5 MG tablet Take 5 mg by mouth daily.   Yes Historical Provider, MD   BP 110/67 mmHg  Pulse 95  Temp(Src) 98.5 F (36.9 C) (Oral)  Resp 19  Ht 5\' 5"  (1.651 m)  Wt 218 lb (98.884 kg)  BMI 36.28 kg/m2  SpO2 98%  LMP  (Approximate)  Vitals reviewed Physical Exam  Physical Examination: General appearance - alert, well appearing, and in no distress Mental status - alert, oriented to person, place, and time Eyes - no conjunctival injection, no scleral icterus Mouth - mucous membranes dry Chest - clear to auscultation, no wheezes, rales or rhonchi, symmetric air entry Heart - normal rate, regular  rhythm, normal S1, S2, no murmurs, rubs, clicks or gallops Abdomen - soft, nontender, nondistended, no masses or organomegaly, nabs, healing abdominal surgical wounds Extremities - peripheral pulses normal, no pedal edema, no clubbing or cyanosis Skin - normal coloration and turgor, no rashes  ED Course  Procedures (including critical care time)  3:15 PM d/w Dr. [name], surgery in [place].  He is familiar with this patient and states that she has developed a chronic stricture in her jejunem since November.  He recommends clear liquids, hydration and when she is discharged to do only clear liquids  x 2 weeks.   4:11 PM d/w Dr. Maryland Pink, [facility] hospitalist- pt to go to med/surg bed Labs Review Labs Reviewed  CBC - Abnormal; Notable for the following:    WBC 17.7 (*)    RBC 5.29 (*)    RDW 19.4 (*)    Platelets 405 (*)    All other components within normal limits  COMPREHENSIVE METABOLIC PANEL - Abnormal; Notable for the following:    Sodium 132 (*)    Glucose, Bld 113 (*)    Creatinine, Ser 1.84 (*)    Calcium 10.6 (*)    GFR calc non Af Amer 31 (*)    GFR calc Af Amer 36 (*)    All other components within normal limits  URINALYSIS, ROUTINE W REFLEX MICROSCOPIC - Abnormal; Notable for the following:    Color, Urine AMBER (*)    APPearance CLOUDY (*)    Bilirubin Urine LARGE (*)    Ketones, ur >80 (*)    Protein, ur 30 (*)    All other components within normal limits  URINE MICROSCOPIC-ADD ON - Abnormal; Notable for the following:    Squamous Epithelial / LPF MANY (*)    Bacteria, UA FEW (*)    Casts HYALINE CASTS (*)    Crystals CA OXALATE CRYSTALS (*)    All other components within normal limits  PROTIME-INR - Abnormal; Notable for the following:    Prothrombin Time 25.8 (*)    INR 2.33 (*)    All other components within normal limits  CBC - Abnormal; Notable for the following:    WBC 11.0 (*)    Hemoglobin 11.2 (*)    MCH 25.6 (*)    RDW 19.7 (*)    All other components within normal limits  LIPASE, BLOOD  BASIC METABOLIC PANEL  HEMOGLOBIN A1C    Imaging Review Ct Abdomen Pelvis Wo Contrast  01/16/2014   CLINICAL DATA:  Vomiting.  History of gastric bypass.  EXAM: CT ABDOMEN AND PELVIS WITHOUT CONTRAST  TECHNIQUE: Multidetector CT imaging of the abdomen and pelvis was performed following the standard protocol without IV contrast.  COMPARISON:  11/29/2013 and 10/11/2012 and 10/10/2013  FINDINGS: There is a focal stricture in the proximal jejunum approximately 16 cm beyond the ligament of Treitz. This is in the area of previous inflammation seen on the study of  11/29/2013. There is soft tissue stranding in the adjacent mesenteric.  The small bowel distal to this area is normal. The colon is normal except for a single diverticulum in the proximal sigmoid region.  There is a 4.5 cm lesion in the inferior aspect of the spleen, essentially unchanged since 10/11/2012. This is most likely a benign lesion.  The liver, biliary tree, pancreas, adrenal glands, and kidneys appear in normal. Ovaries are normal. Uterus has been removed. Bladder is normal. Surgical clips and staples and mesh fixation device is seen in the lower  abdomen and pelvis. No acute osseous abnormality.  Moderate hiatal hernia.  IMPRESSION: Partial small bowel obstruction 16 cm beyond the level of the ligament of Treitz.   Electronically Signed   By: Rozetta Nunnery M.D.   On: 01/16/2014 14:13     EKG Interpretation   Date/Time:  Monday January 16 2014 10:49:13 EST Ventricular Rate:  114 PR Interval:  137 QRS Duration: 77 QT Interval:  299 QTC Calculation: 412 R Axis:   44 Text Interpretation:  Sinus tachycardia Abnormal R-wave progression, early  transition Since previous tracing rate faster Confirmed by [facility]  MD,  Doriana Mazurkiewicz [phone]) on 01/16/2014 11:18:27 AM      MDM   Final diagnoses:  Vomiting    Pt presenting with c/o vomiting- she has hx of prior similar symptoms after her gastric sleeve several months ago.  She appears dehydrated, creatinine is elevated and ketones > 80 in urine.  D/w her surgeon in [place] - he states that he knows about the jejunal stricture - when this occurs he recommends clear liquids x 2 weeks- he is not currently planning any surgical intervention.  Pt received IV fluids and zofran.  Pt feeling improved, will admit overnight for hydration to [facility].      Threasa Beards, MD 01/17/14 [id number]

## 2014-01-16 NOTE — ED Notes (Signed)
Pt having vomiting x 2 days and weakness. sts hx of gastric bypass. sts weak. Denies pain. Pt tachy at triage.

## 2014-01-17 ENCOUNTER — Encounter (HOSPITAL_COMMUNITY): Payer: Self-pay | Admitting: General Practice

## 2014-01-17 ENCOUNTER — Observation Stay (HOSPITAL_COMMUNITY): Payer: BLUE CROSS/BLUE SHIELD

## 2014-01-17 ENCOUNTER — Inpatient Hospital Stay (HOSPITAL_COMMUNITY): Payer: BLUE CROSS/BLUE SHIELD

## 2014-01-17 DIAGNOSIS — Z880 Allergy status to penicillin: Secondary | ICD-10-CM | POA: Diagnosis not present

## 2014-01-17 DIAGNOSIS — Z7901 Long term (current) use of anticoagulants: Secondary | ICD-10-CM | POA: Diagnosis not present

## 2014-01-17 DIAGNOSIS — K56600 Partial intestinal obstruction, unspecified as to cause: Secondary | ICD-10-CM

## 2014-01-17 DIAGNOSIS — D72829 Elevated white blood cell count, unspecified: Secondary | ICD-10-CM | POA: Diagnosis present

## 2014-01-17 DIAGNOSIS — E860 Dehydration: Secondary | ICD-10-CM | POA: Diagnosis present

## 2014-01-17 DIAGNOSIS — Z90710 Acquired absence of both cervix and uterus: Secondary | ICD-10-CM | POA: Diagnosis not present

## 2014-01-17 DIAGNOSIS — R1110 Vomiting, unspecified: Secondary | ICD-10-CM | POA: Diagnosis present

## 2014-01-17 DIAGNOSIS — Z809 Family history of malignant neoplasm, unspecified: Secondary | ICD-10-CM | POA: Diagnosis not present

## 2014-01-17 DIAGNOSIS — Z6836 Body mass index (BMI) 36.0-36.9, adult: Secondary | ICD-10-CM | POA: Diagnosis not present

## 2014-01-17 DIAGNOSIS — E119 Type 2 diabetes mellitus without complications: Secondary | ICD-10-CM | POA: Diagnosis present

## 2014-01-17 DIAGNOSIS — E669 Obesity, unspecified: Secondary | ICD-10-CM | POA: Diagnosis present

## 2014-01-17 DIAGNOSIS — N179 Acute kidney failure, unspecified: Secondary | ICD-10-CM | POA: Diagnosis present

## 2014-01-17 DIAGNOSIS — Z859 Personal history of malignant neoplasm, unspecified: Secondary | ICD-10-CM | POA: Diagnosis not present

## 2014-01-17 DIAGNOSIS — K9509 Other complications of gastric band procedure: Secondary | ICD-10-CM | POA: Diagnosis present

## 2014-01-17 DIAGNOSIS — I10 Essential (primary) hypertension: Secondary | ICD-10-CM | POA: Diagnosis present

## 2014-01-17 HISTORY — DX: Partial intestinal obstruction, unspecified as to cause: K56.600

## 2014-01-17 LAB — CBC
HCT: 36 % (ref 36.0–46.0)
HEMOGLOBIN: 11.2 g/dL — AB (ref 12.0–15.0)
MCH: 25.6 pg — AB (ref 26.0–34.0)
MCHC: 31.1 g/dL (ref 30.0–36.0)
MCV: 82.2 fL (ref 78.0–100.0)
Platelets: 310 10*3/uL (ref 150–400)
RBC: 4.38 MIL/uL (ref 3.87–5.11)
RDW: 19.7 % — ABNORMAL HIGH (ref 11.5–15.5)
WBC: 11 10*3/uL — ABNORMAL HIGH (ref 4.0–10.5)

## 2014-01-17 LAB — BASIC METABOLIC PANEL
ANION GAP: 6 (ref 5–15)
BUN: 15 mg/dL (ref 6–23)
CHLORIDE: 102 meq/L (ref 96–112)
CO2: 24 mmol/L (ref 19–32)
CREATININE: 1.15 mg/dL — AB (ref 0.50–1.10)
Calcium: 9.1 mg/dL (ref 8.4–10.5)
GFR calc non Af Amer: 54 mL/min — ABNORMAL LOW (ref >90)
GFR, EST AFRICAN AMERICAN: 63 mL/min — AB (ref >90)
Glucose, Bld: 83 mg/dL (ref 70–99)
Potassium: 4.2 mmol/L (ref 3.5–5.1)
SODIUM: 132 mmol/L — AB (ref 135–145)

## 2014-01-17 LAB — HEMOGLOBIN A1C
HEMOGLOBIN A1C: 5.9 % — AB (ref <5.7)
Mean Plasma Glucose: 123 mg/dL — ABNORMAL HIGH (ref <117)

## 2014-01-17 LAB — PROTIME-INR
INR: 2.01 — AB (ref 0.00–1.49)
Prothrombin Time: 22.9 seconds — ABNORMAL HIGH (ref 11.6–15.2)

## 2014-01-17 MED ORDER — PHENOL 1.4 % MT LIQD
1.0000 | OROMUCOSAL | Status: DC | PRN
  Administered 2014-01-17: 1 via OROMUCOSAL
  Filled 2014-01-17: qty 177

## 2014-01-17 MED ORDER — KETOROLAC TROMETHAMINE 15 MG/ML IJ SOLN
15.0000 mg | Freq: Three times a day (TID) | INTRAMUSCULAR | Status: DC | PRN
  Administered 2014-01-17 – 2014-01-18 (×2): 15 mg via INTRAVENOUS
  Filled 2014-01-17 (×2): qty 1

## 2014-01-17 MED ORDER — ONDANSETRON HCL 4 MG/2ML IJ SOLN
4.0000 mg | Freq: Once | INTRAMUSCULAR | Status: AC
  Administered 2014-01-17: 4 mg via INTRAVENOUS

## 2014-01-17 MED ORDER — WARFARIN - PHARMACIST DOSING INPATIENT
Freq: Every day | Status: DC
  Administered 2014-01-19: 18:00:00

## 2014-01-17 NOTE — Progress Notes (Signed)
ANTICOAGULATION CONSULT NOTE - Initial Consult  Pharmacy Consult for warfarin Indication: mesenteric thrombus  Allergies  Allergen Reactions  . Shellfish Allergy Anaphylaxis and Swelling  . Demerol [Meperidine] Hives  . Phenergan [Promethazine Hcl] Hives  . Penicillins Rash    Patient Measurements: Height: 5\' 5"  (165.1 cm) Weight: 218 lb (98.884 kg) IBW/kg (Calculated) : 57 Heparin Dosing Weight:   Vital Signs: Temp: 98.5 F (36.9 C) (01/12 0502) Temp Source: Oral (01/12 0502) BP: 110/67 mmHg (01/12 0502) Pulse Rate: 95 (01/12 0502)  Labs:  Recent Labs  01/16/14 1129 01/16/14 1532 01/17/14 0650  HGB 14.3  --  11.2*  HCT 44.2  --  36.0  PLT 405*  --  310  LABPROT  --  25.8*  --   INR  --  2.33*  --   CREATININE 1.84*  --  1.15*    Estimated Creatinine Clearance: 67.4 mL/min (by C-G formula based on Cr of 1.15).   Medical History: Past Medical History  Diagnosis Date  . Asthma   . Diabetes mellitus without complication   . Hypertension   . Cancer endometrial cancer per patient  . Partial bowel obstruction 01/17/2014    Medications:  See EMR  Assessment: 54 yo female on warfarin PTA 5 mg po daily for mesenteric thrombus.  Admitted with N/V from small bowel obstruction. INR yesterday 2.33.   Goal of Therapy:  INR 2-3 Monitor platelets by anticoagulation protocol: Yes   Plan:  Continue warfarin 5 mg po daily Daily INR  Hughes Better, PharmD, BCPS Clinical Pharmacist Pager: [phone] 01/17/2014 2:19 PM

## 2014-01-17 NOTE — Progress Notes (Signed)
[facility] PROGRESS NOTE  [name]:[id number] DOB: Jan 07, 1960 DOA: 01/16/2014 PCP: Yong Channel, MD  Assessment/Plan: SBO -Following gastric bypass surgery in September/2015. -Followed by Dr. [name] of [facility], who relates she has a developed a chronic jejunal stricture. -Has had increasing n/v today and worsening SBO on abdominal film today. -Will revert back to NPO and place NG tube. -Repeat abdominal film in am. -If no improvement by tomorrow, may have to consider inpatient surgical consult.  HTN -Well controlled. -Holding antihypertensive agents.  Leukocytosis -Improving.  ARF -Improving; Cr 1.15 on 1/12 from 1.84 on admission.  Mesenteric Thrombosis -On chronic anticoagulation. -Continue coumadin, will ask pharmacy to dose.  Code Status: Full Code Family Communication: Patient only  Disposition Plan: home when ready   Consultants:  None   Antibiotics:  None   Subjective: Increased n/v today. No real abdominal pain.  Objective: Filed Vitals:   01/16/14 1630 01/16/14 1805 01/16/14 2139 01/17/14 0502  BP: 111/61 101/66 109/68 110/67  Pulse: 91 90 91 95  Temp:  98.7 F (37.1 C) 98.5 F (36.9 C) 98.5 F (36.9 C)  TempSrc:  Oral Oral Oral  Resp: 18 18 18 19   Height:  5\' 5"  (1.651 m)    Weight:  98.884 kg (218 lb)    SpO2: 96% 96% 97% 98%    Intake/Output Summary (Last 24 hours) at 01/17/14 1339 Last data filed at 01/17/14 0934  Gross per 24 hour  Intake 633.75 ml  Output    500 ml  Net 133.75 ml   Filed Weights   01/16/14 1005 01/16/14 1805  Weight: 100.245 kg (221 lb) 98.884 kg (218 lb)    Exam:   General:  AA Ox3, obese  Cardiovascular: RRR  Respiratory: CTA B  Abdomen: obese/S/NT/ND/hypoactive BS  Extremities: no C/C/E   Neurologic:  Intact/non-focal.  Data Reviewed: Basic Metabolic Panel:  Recent Labs Lab 01/16/14 1129 01/17/14 0650  NA 132* 132*  K 4.7 4.2  CL 100 102  CO2 20 24  GLUCOSE  113* 83  BUN 23 15  CREATININE 1.84* 1.15*  CALCIUM 10.6* 9.1   Liver Function Tests:  Recent Labs Lab 01/16/14 1129  AST 25  ALT 23  ALKPHOS 108  BILITOT 1.2  PROT 7.9  ALBUMIN 3.9    Recent Labs Lab 01/16/14 1129  LIPASE 35   No results for input(s): AMMONIA in the last 168 hours. CBC:  Recent Labs Lab 01/16/14 1129 01/17/14 0650  WBC 17.7* 11.0*  HGB 14.3 11.2*  HCT 44.2 36.0  MCV 83.6 82.2  PLT 405* 310   Cardiac Enzymes: No results for input(s): CKTOTAL, CKMB, CKMBINDEX, TROPONINI in the last 168 hours. BNP (last 3 results) No results for input(s): PROBNP in the last 8760 hours. CBG: No results for input(s): GLUCAP in the last 168 hours.  No results found for this or any previous visit (from the past 240 hour(s)).   Studies: Ct Abdomen Pelvis Wo Contrast  01/16/2014   CLINICAL DATA:  Vomiting.  History of gastric bypass.  EXAM: CT ABDOMEN AND PELVIS WITHOUT CONTRAST  TECHNIQUE: Multidetector CT imaging of the abdomen and pelvis was performed following the standard protocol without IV contrast.  COMPARISON:  11/29/2013 and 10/11/2012 and 10/10/2013  FINDINGS: There is a focal stricture in the proximal jejunum approximately 16 cm beyond the ligament of Treitz. This is in the area of previous inflammation seen on the study of 11/29/2013. There is soft tissue stranding in the adjacent mesenteric.  The small bowel distal to this area is normal. The colon is normal except for a single diverticulum in the proximal sigmoid region.  There is a 4.5 cm lesion in the inferior aspect of the spleen, essentially unchanged since 10/11/2012. This is most likely a benign lesion.  The liver, biliary tree, pancreas, adrenal glands, and kidneys appear in normal. Ovaries are normal. Uterus has been removed. Bladder is normal. Surgical clips and staples and mesh fixation device is seen in the lower abdomen and pelvis. No acute osseous abnormality.  Moderate hiatal hernia.  IMPRESSION:  Partial small bowel obstruction 16 cm beyond the level of the ligament of Treitz.   Electronically Signed   By: Rozetta Nunnery M.D.   On: 01/16/2014 14:13   Abd 1 View (kub)  01/17/2014   CLINICAL DATA:  Vomiting for 2 days.  Small bowel obstruction.  EXAM: ABDOMEN - 1 VIEW  COMPARISON:  11/29/2013 and CT 01/16/2014  FINDINGS: Examination demonstrates apparent contrast throughout the colon. There is interval worsening of a dilated air-filled small bowel loops in the left No free peritoneal air. Multiple surgical clips over the midline pelvis likely due to prior hernia repair. Exam is unchanged.  IMPRESSION: Moderately dilated air-filled small bowel loop in the left upper quadrant increased in diameter compared to the recent CT scan the in measuring 9.4 cm. This is compatible with ongoing proximal early versus partial small bowel obstruction.   Electronically Signed   By: Marin Olp M.D.   On: 01/17/2014 08:48    Scheduled Meds: . pantoprazole (PROTONIX) IV  40 mg Intravenous QHS  . warfarin  5 mg Oral q1800  . Warfarin - Physician Dosing Inpatient   Does not apply q1800   Continuous Infusions: . sodium chloride 75 mL/hr at 01/17/14 6834    Principal Problem:   Partial small bowel obstruction Active Problems:   Dehydration   Hypertension   Diabetes mellitus without complication   Complication of gastric band procedure   Leukocytosis   Nausea with vomiting   ARF (acute renal failure)   Chronic anticoagulation   Mesenteric thrombosis   SBO (small bowel obstruction)    Time spent: 35 minutes.  Greater than 50% of this time was spent in direct contact with the patient coordinating care.    Lelon Frohlich  [facility] Pager [phone]  If 7PM-7AM, please contact night-coverage at [email or web address], password [facility] 01/17/2014, 1:39 PM  LOS: 1 day

## 2014-01-17 NOTE — Progress Notes (Signed)
UR completed 

## 2014-01-18 ENCOUNTER — Inpatient Hospital Stay (HOSPITAL_COMMUNITY): Payer: BLUE CROSS/BLUE SHIELD

## 2014-01-18 LAB — CBC
HCT: 37.1 % (ref 36.0–46.0)
HEMOGLOBIN: 11.5 g/dL — AB (ref 12.0–15.0)
MCH: 25.9 pg — ABNORMAL LOW (ref 26.0–34.0)
MCHC: 31 g/dL (ref 30.0–36.0)
MCV: 83.6 fL (ref 78.0–100.0)
PLATELETS: 323 10*3/uL (ref 150–400)
RBC: 4.44 MIL/uL (ref 3.87–5.11)
RDW: 19.9 % — ABNORMAL HIGH (ref 11.5–15.5)
WBC: 8.2 10*3/uL (ref 4.0–10.5)

## 2014-01-18 LAB — BASIC METABOLIC PANEL
ANION GAP: 9 (ref 5–15)
BUN: 15 mg/dL (ref 6–23)
CALCIUM: 9.7 mg/dL (ref 8.4–10.5)
CO2: 22 mmol/L (ref 19–32)
Chloride: 104 mEq/L (ref 96–112)
Creatinine, Ser: 1.15 mg/dL — ABNORMAL HIGH (ref 0.50–1.10)
GFR calc Af Amer: 63 mL/min — ABNORMAL LOW (ref >90)
GFR calc non Af Amer: 54 mL/min — ABNORMAL LOW (ref >90)
Glucose, Bld: 71 mg/dL (ref 70–99)
Potassium: 3.9 mmol/L (ref 3.5–5.1)
SODIUM: 135 mmol/L (ref 135–145)

## 2014-01-18 LAB — PROTIME-INR
INR: 2.19 — AB (ref 0.00–1.49)
PROTHROMBIN TIME: 24.5 s — AB (ref 11.6–15.2)

## 2014-01-18 NOTE — Progress Notes (Signed)
Patient Demographics  [name], is a 54 y.o. female, DOB - 1960/04/01, DXI:[id number]  Admit date - 01/16/2014   Admitting Physician Annita Brod, MD  Outpatient Primary MD for the patient is Yong Channel, MD  LOS - 2   Chief Complaint  Patient presents with  . Emesis        Subjective:   [name] today has, No headache, No chest pain, No abdominal pain - No Nausea, No new weakness tingling or numbness, No Cough - SOB.    Assessment & Plan    1. Recurrent Partial small bowel obstruction - multiple episodes in the past, much improved with conservative treatment, continue NG tube, bowel rest, repeat x-ray much better this morning. She is now symptom-free. Will Clamp NG tube every 4 hours and increase ambulation and activity. Once passing flatus will place on clear liquid diet. Leukocytosis has resolved.   2. History of mesenteric artery thrombosis. On Coumadin continue pharmacy monitoring INR.   3. Dehydration and acute renal failure secondary to nausea vomiting from partial small bowel obstruction. Resolved after IV fluids.   4. Essential hypertension. Stable off medications.     Code Status: Full  Family Communication:    Disposition Plan: Home   Procedures CT Abd -Pelvis   Consults     Medications  Scheduled Meds: . pantoprazole (PROTONIX) IV  40 mg Intravenous QHS  . warfarin  5 mg Oral q1800  . Warfarin - Pharmacist Dosing Inpatient   Does not apply q1800   Continuous Infusions: . sodium chloride 75 mL/hr at 01/17/14 2009   PRN Meds:.albuterol, ketorolac, ondansetron **OR** ondansetron (ZOFRAN) IV, phenol  DVT Prophylaxis  Couamdin  Lab Results  Component Value Date   INR 2.19* 01/18/2014   INR 2.01* 01/17/2014   INR 2.33* 01/16/2014     Lab  Results  Component Value Date   PLT 323 01/18/2014    Antibiotics  35  Anti-infectives    None          Objective:   Filed Vitals:   01/17/14 0502 01/17/14 1415 01/17/14 2033 01/18/14 0448  BP: 110/67 108/75 95/52 110/70  Pulse: 95 85 80 78  Temp: 98.5 F (36.9 C) 98 F (36.7 C) 98.6 F (37 C) 98.4 F (36.9 C)  TempSrc: Oral Oral Oral Oral  Resp: 19 19 18 19   Height:      Weight:      SpO2: 98% 99% 99% 99%    Wt Readings from Last 3 Encounters:  01/16/14 98.884 kg (218 lb)  05/19/13 134.718 kg (297 lb)  10/11/12 122.471 kg (270 lb)     Intake/Output Summary (Last 24 hours) at 01/18/14 0951 Last data filed at 01/18/14 0849  Gross per 24 hour  Intake   1725 ml  Output   2251 ml  Net   -526 ml     Physical Exam  Awake Alert, Oriented X 3, No new F.N deficits, Normal affect [place].AT,PERRAL Supple Neck,No JVD, No cervical lymphadenopathy appriciated.  Symmetrical Chest wall movement, Good air movement bilaterally, CTAB RRR,No Gallops,Rubs or new Murmurs, No Parasternal Heave +ve B.Sounds, Abd Soft, No tenderness, No organomegaly appriciated, No rebound - guarding or rigidity. NG in place. No Cyanosis, Clubbing or edema, No  new Rash or bruise     Data Review   Micro Results No results found for this or any previous visit (from the past 240 hour(s)).  Radiology Reports Ct Abdomen Pelvis Wo Contrast  01/16/2014   CLINICAL DATA:  Vomiting.  History of gastric bypass.  EXAM: CT ABDOMEN AND PELVIS WITHOUT CONTRAST  TECHNIQUE: Multidetector CT imaging of the abdomen and pelvis was performed following the standard protocol without IV contrast.  COMPARISON:  11/29/2013 and 10/11/2012 and 10/10/2013  FINDINGS: There is a focal stricture in the proximal jejunum approximately 16 cm beyond the ligament of Treitz. This is in the area of previous inflammation seen on the study of 11/29/2013. There is soft tissue stranding in the adjacent mesenteric.  The small bowel distal  to this area is normal. The colon is normal except for a single diverticulum in the proximal sigmoid region.  There is a 4.5 cm lesion in the inferior aspect of the spleen, essentially unchanged since 10/11/2012. This is most likely a benign lesion.  The liver, biliary tree, pancreas, adrenal glands, and kidneys appear in normal. Ovaries are normal. Uterus has been removed. Bladder is normal. Surgical clips and staples and mesh fixation device is seen in the lower abdomen and pelvis. No acute osseous abnormality.  Moderate hiatal hernia.  IMPRESSION: Partial small bowel obstruction 16 cm beyond the level of the ligament of Treitz.   Electronically Signed   By: Rozetta Nunnery M.D.   On: 01/16/2014 14:13   Dg Abd 1 View  01/17/2014   CLINICAL DATA:  NG tube placement.  EXAM: ABDOMEN - 1 VIEW  COMPARISON:  Earlier films, same date.  FINDINGS: The NG tube tip is likely in the region of the duodenum bulb. Persistent dilated small bowel loops.  IMPRESSION: NG tube tip likely in the region of the duodenum bulb.   Electronically Signed   By: Kalman Jewels M.D.   On: 01/17/2014 17:23   Abd 1 View (kub)  01/17/2014   CLINICAL DATA:  Vomiting for 2 days.  Small bowel obstruction.  EXAM: ABDOMEN - 1 VIEW  COMPARISON:  11/29/2013 and CT 01/16/2014  FINDINGS: Examination demonstrates apparent contrast throughout the colon. There is interval worsening of a dilated air-filled small bowel loops in the left No free peritoneal air. Multiple surgical clips over the midline pelvis likely due to prior hernia repair. Exam is unchanged.  IMPRESSION: Moderately dilated air-filled small bowel loop in the left upper quadrant increased in diameter compared to the recent CT scan the in measuring 9.4 cm. This is compatible with ongoing proximal early versus partial small bowel obstruction.   Electronically Signed   By: Marin Olp M.D.   On: 01/17/2014 08:48   Dg Abd Portable 1v  01/18/2014   CLINICAL DATA:  Small bowel obstruction  follow-up.  EXAM: PORTABLE ABDOMEN - 1 VIEW  COMPARISON:  01/17/2014 and 01/16/2014  FINDINGS: Nasogastric tube tip is in the duodenum bulb region and unchanged. Again noted is oral contrast in the colon. The gas-filled and distended small bowel loop in the mid abdomen appears to be decompressed. There is now a mildly dilated loop of small bowel in the left upper abdomen. Evidence for surgical mesh in the pelvis.  IMPRESSION: Decreased small bowel gaseous distension in the upper abdomen.  Stable position of the nasogastric tube. Tube is likely in the proximal duodenum.   Electronically Signed   By: Markus Daft M.D.   On: 01/18/2014 08:33     CBC  Recent Labs Lab 01/16/14 1129 01/17/14 0650 01/18/14 0410  WBC 17.7* 11.0* 8.2  HGB 14.3 11.2* 11.5*  HCT 44.2 36.0 37.1  PLT 405* 310 323  MCV 83.6 82.2 83.6  MCH 27.0 25.6* 25.9*  MCHC 32.4 31.1 31.0  RDW 19.4* 19.7* 19.9*    Chemistries   Recent Labs Lab 01/16/14 1129 01/17/14 0650 01/18/14 0410  NA 132* 132* 135  K 4.7 4.2 3.9  CL 100 102 104  CO2 20 24 22   GLUCOSE 113* 83 71  BUN 23 15 15   CREATININE 1.84* 1.15* 1.15*  CALCIUM 10.6* 9.1 9.7  AST 25  --   --   ALT 23  --   --   ALKPHOS 108  --   --   BILITOT 1.2  --   --    ------------------------------------------------------------------------------------------------------------------ estimated creatinine clearance is 67.4 mL/min (by C-G formula based on Cr of 1.15). ------------------------------------------------------------------------------------------------------------------  Recent Labs  01/17/14 0650  HGBA1C 5.9*   ------------------------------------------------------------------------------------------------------------------ No results for input(s): CHOL, HDL, LDLCALC, TRIG, CHOLHDL, LDLDIRECT in the last 72 hours. ------------------------------------------------------------------------------------------------------------------ No results for input(s): TSH,  T4TOTAL, T3FREE, THYROIDAB in the last 72 hours.  Invalid input(s): FREET3 ------------------------------------------------------------------------------------------------------------------ No results for input(s): VITAMINB12, FOLATE, FERRITIN, TIBC, IRON, RETICCTPCT in the last 72 hours.  Coagulation profile  Recent Labs Lab 01/16/14 1532 01/17/14 1900 01/18/14 0410  INR 2.33* 2.01* 2.19*    No results for input(s): DDIMER in the last 72 hours.  Cardiac Enzymes No results for input(s): CKMB, TROPONINI, MYOGLOBIN in the last 168 hours.  Invalid input(s): CK ------------------------------------------------------------------------------------------------------------------ Invalid input(s): POCBNP     Time Spent in minutes   35   [place] K M.D on 01/18/2014 at 9:51 AM  Between 7am to 7pm - Pager - [phone]  After 7pm go to [email or web address] - [facility] Office  [phone]

## 2014-01-18 NOTE — Progress Notes (Signed)
ANTICOAGULATION CONSULT NOTE - Follow-up Consult  Pharmacy Consult for warfarin Indication: mesenteric thrombus  Allergies  Allergen Reactions  . Shellfish Allergy Anaphylaxis and Swelling  . Demerol [Meperidine] Hives  . Phenergan [Promethazine Hcl] Hives  . Penicillins Rash    Patient Measurements: Height: 5\' 5"  (165.1 cm) Weight: 218 lb (98.884 kg) IBW/kg (Calculated) : 57  Vital Signs: Temp: 98.4 F (36.9 C) (01/13 0448) Temp Source: Oral (01/13 0448) BP: 110/70 mmHg (01/13 0448) Pulse Rate: 78 (01/13 0448)  Labs:  Recent Labs  01/16/14 1129 01/16/14 1532 01/17/14 0650 01/17/14 1900 01/18/14 0410  HGB 14.3  --  11.2*  --  11.5*  HCT 44.2  --  36.0  --  37.1  PLT 405*  --  310  --  323  LABPROT  --  25.8*  --  22.9* 24.5*  INR  --  2.33*  --  2.01* 2.19*  CREATININE 1.84*  --  1.15*  --  1.15*    Estimated Creatinine Clearance: 67.4 mL/min (by C-G formula based on Cr of 1.15).  Assessment: 54 yo female on warfarin PTA 5 mg po daily for mesenteric thrombus.  Admitted with N/V from small bowel obstruction. INR remains therapeutic on home dose. CBC stable. No bleeding noted.  Goal of Therapy:  INR 2-3 Monitor platelets by anticoagulation protocol: Yes   Plan:  Continue warfarin 5 mg po daily Daily INR  Sherlon Handing, PharmD, BCPS Clinical pharmacist, pager [phone] 01/18/2014 11:21 AM

## 2014-01-19 ENCOUNTER — Inpatient Hospital Stay (HOSPITAL_COMMUNITY): Payer: BLUE CROSS/BLUE SHIELD

## 2014-01-19 LAB — PROTIME-INR
INR: 2.16 — AB (ref 0.00–1.49)
Prothrombin Time: 24.3 seconds — ABNORMAL HIGH (ref 11.6–15.2)

## 2014-01-19 MED ORDER — WARFARIN SODIUM 5 MG PO TABS
5.0000 mg | ORAL_TABLET | Freq: Once | ORAL | Status: AC
Start: 2014-01-19 — End: 2014-01-19
  Administered 2014-01-19: 5 mg via ORAL

## 2014-01-19 NOTE — Progress Notes (Signed)
Patient Demographics  [name], is a 54 y.o. female, DOB - 1960-06-24, HWY:[id number]  Admit date - 01/16/2014   Admitting Physician Annita Brod, MD  Outpatient Primary MD for the patient is Yong Channel, MD  LOS - 3   Chief Complaint  Patient presents with  . Emesis        Subjective:   [name] today has, No headache, No chest pain, No abdominal pain - No Nausea, No new weakness tingling or numbness, No Cough - SOB.    Assessment & Plan    1. Recurrent Partial small bowel obstruction - multiple episodes in the past, much improved with conservative treatment, continue NG tube, bowel rest, repeat x-ray much better this morning. She is now symptom-free. X-ray repeat is unremarkable this morning, we'll clamp NG and place on clear liquids.   2. History of mesenteric artery thrombosis. On Coumadin continue pharmacy monitoring INR.   3. Dehydration and acute renal failure secondary to nausea vomiting from partial small bowel obstruction. Resolved after IV fluids.   4. Essential hypertension. Stable off medications.     Code Status: Full  Family Communication:    Disposition Plan: Home   Procedures CT Abd -Pelvis   Consults     Medications  Scheduled Meds: . pantoprazole (PROTONIX) IV  40 mg Intravenous QHS  . warfarin  5 mg Oral q1800  . Warfarin - Pharmacist Dosing Inpatient   Does not apply q1800   Continuous Infusions: . sodium chloride 75 mL/hr at 01/17/14 2009   PRN Meds:.albuterol, ketorolac, ondansetron **OR** ondansetron (ZOFRAN) IV, phenol  DVT Prophylaxis  Couamdin  Lab Results  Component Value Date   INR 2.16* 01/19/2014   INR 2.19* 01/18/2014   INR 2.01* 01/17/2014     Lab Results  Component Value Date   PLT 323 01/18/2014     Antibiotics  35  Anti-infectives    None          Objective:   Filed Vitals:   01/18/14 0448 01/18/14 1415 01/18/14 2122 01/19/14 0539  BP: 110/70 115/73 141/78 135/84  Pulse: 78 84 81 67  Temp: 98.4 F (36.9 C) 98.1 F (36.7 C) 98.3 F (36.8 C) 97.4 F (36.3 C)  TempSrc: Oral Oral Oral   Resp: 19 18 18 17   Height:      Weight:      SpO2: 99% 97% 98% 98%    Wt Readings from Last 3 Encounters:  01/16/14 98.884 kg (218 lb)  05/19/13 134.718 kg (297 lb)  10/11/12 122.471 kg (270 lb)     Intake/Output Summary (Last 24 hours) at 01/19/14 0905 Last data filed at 01/19/14 0700  Gross per 24 hour  Intake   1875 ml  Output    900 ml  Net    975 ml     Physical Exam  Awake Alert, Oriented X 3, No new F.N deficits, Normal affect [place].AT,PERRAL Supple Neck,No JVD, No cervical lymphadenopathy appriciated.  Symmetrical Chest wall movement, Good air movement bilaterally, CTAB RRR,No Gallops,Rubs or new Murmurs, No Parasternal Heave +ve B.Sounds, Abd Soft, No tenderness, No organomegaly appriciated, No rebound - guarding or rigidity. NG in place. No Cyanosis, Clubbing or edema, No new Rash or bruise  Data Review   Micro Results No results found for this or any previous visit (from the past 240 hour(s)).  Radiology Reports Ct Abdomen Pelvis Wo Contrast  01/16/2014   CLINICAL DATA:  Vomiting.  History of gastric bypass.  EXAM: CT ABDOMEN AND PELVIS WITHOUT CONTRAST  TECHNIQUE: Multidetector CT imaging of the abdomen and pelvis was performed following the standard protocol without IV contrast.  COMPARISON:  11/29/2013 and 10/11/2012 and 10/10/2013  FINDINGS: There is a focal stricture in the proximal jejunum approximately 16 cm beyond the ligament of Treitz. This is in the area of previous inflammation seen on the study of 11/29/2013. There is soft tissue stranding in the adjacent mesenteric.  The small bowel distal to this area is normal. The colon is normal except  for a single diverticulum in the proximal sigmoid region.  There is a 4.5 cm lesion in the inferior aspect of the spleen, essentially unchanged since 10/11/2012. This is most likely a benign lesion.  The liver, biliary tree, pancreas, adrenal glands, and kidneys appear in normal. Ovaries are normal. Uterus has been removed. Bladder is normal. Surgical clips and staples and mesh fixation device is seen in the lower abdomen and pelvis. No acute osseous abnormality.  Moderate hiatal hernia.  IMPRESSION: Partial small bowel obstruction 16 cm beyond the level of the ligament of Treitz.   Electronically Signed   By: Rozetta Nunnery M.D.   On: 01/16/2014 14:13   Dg Abd 1 View  01/17/2014   CLINICAL DATA:  NG tube placement.  EXAM: ABDOMEN - 1 VIEW  COMPARISON:  Earlier films, same date.  FINDINGS: The NG tube tip is likely in the region of the duodenum bulb. Persistent dilated small bowel loops.  IMPRESSION: NG tube tip likely in the region of the duodenum bulb.   Electronically Signed   By: Kalman Jewels M.D.   On: 01/17/2014 17:23   Abd 1 View (kub)  01/17/2014   CLINICAL DATA:  Vomiting for 2 days.  Small bowel obstruction.  EXAM: ABDOMEN - 1 VIEW  COMPARISON:  11/29/2013 and CT 01/16/2014  FINDINGS: Examination demonstrates apparent contrast throughout the colon. There is interval worsening of a dilated air-filled small bowel loops in the left No free peritoneal air. Multiple surgical clips over the midline pelvis likely due to prior hernia repair. Exam is unchanged.  IMPRESSION: Moderately dilated air-filled small bowel loop in the left upper quadrant increased in diameter compared to the recent CT scan the in measuring 9.4 cm. This is compatible with ongoing proximal early versus partial small bowel obstruction.   Electronically Signed   By: Marin Olp M.D.   On: 01/17/2014 08:48   Dg Abd Portable 1v  01/19/2014   CLINICAL DATA:  Acute onset abdominal pain 3 days ago. Follow-up partial small bowel  obstruction. Evaluate nasogastric tube.  EXAM: PORTABLE ABDOMEN - 1 VIEW  COMPARISON:  Portable abdomen x-ray yesterday and 01/17/2014. CT abdomen and pelvis 01/16/2014.  FINDINGS: Bowel gas pattern unremarkable with no evidence of small bowel dilation as noted on the prior CT. Oral contrast material from the prior CT throughout decompressed colon. Nasogastric tube tip projects over the distal stomach. Surgical mesh overlying the pelvis.  IMPRESSION: No acute abdominal abnormality currently. Nasogastric tube tip projects over the distal stomach.   Electronically Signed   By: [name] M.D.   On: 01/19/2014 07:57   Dg Abd Portable 1v  01/18/2014   CLINICAL DATA:  Small bowel obstruction follow-up.  EXAM: PORTABLE  ABDOMEN - 1 VIEW  COMPARISON:  01/17/2014 and 01/16/2014  FINDINGS: Nasogastric tube tip is in the duodenum bulb region and unchanged. Again noted is oral contrast in the colon. The gas-filled and distended small bowel loop in the mid abdomen appears to be decompressed. There is now a mildly dilated loop of small bowel in the left upper abdomen. Evidence for surgical mesh in the pelvis.  IMPRESSION: Decreased small bowel gaseous distension in the upper abdomen.  Stable position of the nasogastric tube. Tube is likely in the proximal duodenum.   Electronically Signed   By: Markus Daft M.D.   On: 01/18/2014 08:33     CBC  Recent Labs Lab 01/16/14 1129 01/17/14 0650 01/18/14 0410  WBC 17.7* 11.0* 8.2  HGB 14.3 11.2* 11.5*  HCT 44.2 36.0 37.1  PLT 405* 310 323  MCV 83.6 82.2 83.6  MCH 27.0 25.6* 25.9*  MCHC 32.4 31.1 31.0  RDW 19.4* 19.7* 19.9*    Chemistries   Recent Labs Lab 01/16/14 1129 01/17/14 0650 01/18/14 0410  NA 132* 132* 135  K 4.7 4.2 3.9  CL 100 102 104  CO2 20 24 22   GLUCOSE 113* 83 71  BUN 23 15 15   CREATININE 1.84* 1.15* 1.15*  CALCIUM 10.6* 9.1 9.7  AST 25  --   --   ALT 23  --   --   ALKPHOS 108  --   --   BILITOT 1.2  --   --     ------------------------------------------------------------------------------------------------------------------ estimated creatinine clearance is 67.4 mL/min (by C-G formula based on Cr of 1.15). ------------------------------------------------------------------------------------------------------------------  Recent Labs  01/17/14 0650  HGBA1C 5.9*   ------------------------------------------------------------------------------------------------------------------ No results for input(s): CHOL, HDL, LDLCALC, TRIG, CHOLHDL, LDLDIRECT in the last 72 hours. ------------------------------------------------------------------------------------------------------------------ No results for input(s): TSH, T4TOTAL, T3FREE, THYROIDAB in the last 72 hours.  Invalid input(s): FREET3 ------------------------------------------------------------------------------------------------------------------ No results for input(s): VITAMINB12, FOLATE, FERRITIN, TIBC, IRON, RETICCTPCT in the last 72 hours.  Coagulation profile  Recent Labs Lab 01/16/14 1532 01/17/14 1900 01/18/14 0410 01/19/14 0516  INR 2.33* 2.01* 2.19* 2.16*    No results for input(s): DDIMER in the last 72 hours.  Cardiac Enzymes No results for input(s): CKMB, TROPONINI, MYOGLOBIN in the last 168 hours.  Invalid input(s): CK ------------------------------------------------------------------------------------------------------------------ Invalid input(s): POCBNP     Time Spent in minutes   35   Lala Lund K M.D on 01/19/2014 at 9:05 AM  Between 7am to 7pm - Pager - [phone]  After 7pm go to [email or web address] - [facility] Office  [phone]

## 2014-01-19 NOTE — Progress Notes (Signed)
Pt complained of nausea and vomited moderate amount of green emesis.  Patient's NG reconnected to [facility].  1000cc green liquid drained. Pt medicated with Zofran IV.  Notified Leonie Man call for [facility].  Will leave NG connected to [facility].

## 2014-01-19 NOTE — Progress Notes (Signed)
  ANTICOAGULATION CONSULT NOTE - Follow-up Consult  Pharmacy Consult for warfarin Indication: mesenteric thrombus  Allergies  Allergen Reactions  . Shellfish Allergy Anaphylaxis and Swelling  . Demerol [Meperidine] Hives  . Phenergan [Promethazine Hcl] Hives  . Penicillins Rash    Patient Measurements: Height: 5\' 5"  (165.1 cm) Weight: 218 lb (98.884 kg) IBW/kg (Calculated) : 57  Vital Signs: Temp: 97.4 F (36.3 C) (01/14 0539) BP: 135/84 mmHg (01/14 0539) Pulse Rate: 67 (01/14 0539)  Labs:  Recent Labs  01/17/14 0650 01/17/14 1900 01/18/14 0410 01/19/14 0516  HGB 11.2*  --  11.5*  --   HCT 36.0  --  37.1  --   PLT 310  --  323  --   LABPROT  --  22.9* 24.5* 24.3*  INR  --  2.01* 2.19* 2.16*  CREATININE 1.15*  --  1.15*  --     Estimated Creatinine Clearance: 67.4 mL/min (by C-G formula based on Cr of 1.15).  Assessment: 54 yo female on warfarin PTA 5 mg po daily for mesenteric thrombus.  Admitted with N/V from small bowel obstruction. INR today is therapeutic despite RN charting missed doses due to being NPOon 1/12 + 1/13 (INR 2.16 << 2.19, goal of 2-3). Clear liquid diet ordered today so dose should be given. Since pt has missed 2 doses, will go ahead and give 5 mg and monitor INR trends. Pt may have increased sensitivity due to being NPO and may need an empiric dose reduction. No CBC today - no overt s/sx of bleeding noted.  Goal of Therapy:  INR 2-3 Monitor platelets by anticoagulation protocol: Yes   Plan:  1. Warfarin 5 mg x 1 dose at 1800 today 2. Will continue to monitor for any signs/symptoms of bleeding and will follow up with PT/INR in the a.m.   Alycia Rossetti, PharmD, BCPS Clinical Pharmacist Pager: [phone] 01/19/2014 2:04 PM

## 2014-01-20 ENCOUNTER — Encounter (HOSPITAL_COMMUNITY): Payer: Self-pay | Admitting: General Surgery

## 2014-01-20 LAB — CBC
HCT: 42.2 % (ref 36.0–46.0)
Hemoglobin: 13.1 g/dL (ref 12.0–15.0)
MCH: 26.1 pg (ref 26.0–34.0)
MCHC: 31 g/dL (ref 30.0–36.0)
MCV: 84.2 fL (ref 78.0–100.0)
PLATELETS: 416 10*3/uL — AB (ref 150–400)
RBC: 5.01 MIL/uL (ref 3.87–5.11)
RDW: 19.8 % — ABNORMAL HIGH (ref 11.5–15.5)
WBC: 12.8 10*3/uL — ABNORMAL HIGH (ref 4.0–10.5)

## 2014-01-20 LAB — BASIC METABOLIC PANEL
Anion gap: 12 (ref 5–15)
BUN: 11 mg/dL (ref 6–23)
CO2: 22 mmol/L (ref 19–32)
Calcium: 10.4 mg/dL (ref 8.4–10.5)
Chloride: 107 mEq/L (ref 96–112)
Creatinine, Ser: 1.22 mg/dL — ABNORMAL HIGH (ref 0.50–1.10)
GFR calc Af Amer: 58 mL/min — ABNORMAL LOW (ref >90)
GFR calc non Af Amer: 50 mL/min — ABNORMAL LOW (ref >90)
Glucose, Bld: 93 mg/dL (ref 70–99)
Potassium: 4.4 mmol/L (ref 3.5–5.1)
SODIUM: 141 mmol/L (ref 135–145)

## 2014-01-20 LAB — PROTIME-INR
INR: 2.24 — ABNORMAL HIGH (ref 0.00–1.49)
Prothrombin Time: 24.9 seconds — ABNORMAL HIGH (ref 11.6–15.2)

## 2014-01-20 MED ORDER — SALINE SPRAY 0.65 % NA SOLN
1.0000 | NASAL | Status: DC | PRN
  Filled 2014-01-20: qty 44

## 2014-01-20 MED ORDER — WARFARIN SODIUM 5 MG PO TABS
5.0000 mg | ORAL_TABLET | Freq: Once | ORAL | Status: AC
  Administered 2014-01-20: 5 mg
  Filled 2014-01-20: qty 1

## 2014-01-20 NOTE — Progress Notes (Signed)
  ANTICOAGULATION CONSULT NOTE - Follow-up Consult  Pharmacy Consult for warfarin Indication: mesenteric thrombus  Allergies  Allergen Reactions  . Shellfish Allergy Anaphylaxis and Swelling  . Demerol [Meperidine] Hives  . Phenergan [Promethazine Hcl] Hives  . Penicillins Rash    Patient Measurements: Height: 5\' 5"  (165.1 cm) Weight: 218 lb (98.884 kg) IBW/kg (Calculated) : 57  Vital Signs: Temp: 98.4 F (36.9 C) (01/15 1313) Temp Source: Oral (01/15 1313) BP: 114/68 mmHg (01/15 1313) Pulse Rate: 67 (01/15 1313)  Labs:  Recent Labs  01/18/14 0410 01/19/14 0516 01/20/14 0607 01/20/14 1300  HGB 11.5*  --   --  13.1  HCT 37.1  --   --  42.2  PLT 323  --   --  416*  LABPROT 24.5* 24.3* 24.9*  --   INR 2.19* 2.16* 2.24*  --   CREATININE 1.15*  --   --  1.22*    Estimated Creatinine Clearance: 63.6 mL/min (by C-G formula based on Cr of 1.22).  Assessment: 54 yo female on warfarin PTA 5 mg po daily for mesenteric thrombus.  Admitted with N/V from small bowel obstruction. INR today is therapeutic despite RN charting missed doses due to being NPOon 1/12 + 1/13  Clear liquid diet ordered but patient did not tolerate.  Dose charted as given 1/14 . INR today 2.24 even though doses missed  Pt may have increased sensitivity due to being NPO.  Goal of Therapy:  INR 2-3 Monitor platelets by anticoagulation protocol: Yes   Plan:  1. Warfarin 5 mg x 1 dose at 1800 today 2. Will continue to monitor for any signs/symptoms of bleeding and will follow up with PT/INR in the a.m.   Thanks for allowing pharmacy to be a part of this patient's care.  Excell Seltzer, PharmD Clinical Pharmacist, [phone] 01/20/2014 1:48 PM

## 2014-01-20 NOTE — Progress Notes (Addendum)
Patient Demographics  [name], is a 54 y.o. female, DOB - 01-28-1960, IWP:[id number]  Admit date - 01/16/2014   Admitting Physician Annita Brod, MD  Outpatient Primary MD for the patient is Yong Channel, MD  LOS - 4   Chief Complaint  Patient presents with  . Emesis        Subjective:   [name] today has, No headache, No chest pain, No abdominal pain - No Nausea, No new weakness tingling or numbness, No Cough - SOB.    Assessment & Plan    1. Recurrent Partial small bowel obstruction - multiple episodes in the past, much improved with conservative treatment, continue NG tube, bowel rest, repeat x-ray much better this morning. NG was clamped and she was given clear liquids on 11/20/2014, she developed nausea vomiting again and NG had to be replaced with 1 L of gastric fluid removal, we will make nothing by mouth, repeat x-ray and consult surgery now.   D/W pts surgeon Dr. [name] - [phone] on 01-20-14 @ 2.35 pm, DC NG, clears, if OK DC in am.    2. History of mesenteric artery thrombosis. On Coumadin continue pharmacy monitoring INR.    3. Dehydration and acute renal failure secondary to nausea vomiting from partial small bowel obstruction. Resolved after IV fluids.    4. Essential hypertension. Stable off medications.     Code Status: Full  Family Communication:    Disposition Plan: Home   Procedures CT Abd -Pelvis   Consults     Medications  Scheduled Meds: . pantoprazole (PROTONIX) IV  40 mg Intravenous QHS  . Warfarin - Pharmacist Dosing Inpatient   Does not apply q1800   Continuous Infusions: . sodium chloride 75 mL/hr at 01/20/14 0318   PRN Meds:.albuterol, ketorolac, ondansetron **OR** ondansetron (ZOFRAN) IV, phenol  DVT Prophylaxis   Couamdin  Lab Results  Component Value Date   INR 2.24* 01/20/2014   INR 2.16* 01/19/2014   INR 2.19* 01/18/2014     Lab Results  Component Value Date   PLT 323 01/18/2014    Antibiotics  35  Anti-infectives    None          Objective:   Filed Vitals:   01/19/14 1345 01/19/14 1832 01/19/14 2143 01/20/14 0541  BP: 144/76 143/70 145/74 114/67  Pulse: 74 83 79 94  Temp: 97.8 F (36.6 C) 97.1 F (36.2 C) 98.4 F (36.9 C) 98.6 F (37 C)  TempSrc: Oral Oral Oral Oral  Resp: 16 18 18    Height:      Weight:      SpO2: 97% 98% 95% 94%    Wt Readings from Last 3 Encounters:  01/16/14 98.884 kg (218 lb)  05/19/13 134.718 kg (297 lb)  10/11/12 122.471 kg (270 lb)     Intake/Output Summary (Last 24 hours) at 01/20/14 1012 Last data filed at 01/20/14 0603  Gross per 24 hour  Intake 2088.75 ml  Output   1500 ml  Net 588.75 ml     Physical Exam  Awake Alert, Oriented X 3, No new F.N deficits, Normal affect [place].AT,PERRAL Supple Neck,No JVD, No cervical lymphadenopathy appriciated.  Symmetrical Chest wall movement, Good air movement bilaterally, CTAB RRR,No Gallops,Rubs or new Murmurs, No  Parasternal Heave +ve B.Sounds, Abd Soft, No tenderness, No organomegaly appriciated, No rebound - guarding or rigidity. NG in place. No Cyanosis, Clubbing or edema, No new Rash or bruise     Data Review   Micro Results No results found for this or any previous visit (from the past 240 hour(s)).  Radiology Reports Ct Abdomen Pelvis Wo Contrast  01/16/2014   CLINICAL DATA:  Vomiting.  History of gastric bypass.  EXAM: CT ABDOMEN AND PELVIS WITHOUT CONTRAST  TECHNIQUE: Multidetector CT imaging of the abdomen and pelvis was performed following the standard protocol without IV contrast.  COMPARISON:  11/29/2013 and 10/11/2012 and 10/10/2013  FINDINGS: There is a focal stricture in the proximal jejunum approximately 16 cm beyond the ligament of Treitz. This is in the area of  previous inflammation seen on the study of 11/29/2013. There is soft tissue stranding in the adjacent mesenteric.  The small bowel distal to this area is normal. The colon is normal except for a single diverticulum in the proximal sigmoid region.  There is a 4.5 cm lesion in the inferior aspect of the spleen, essentially unchanged since 10/11/2012. This is most likely a benign lesion.  The liver, biliary tree, pancreas, adrenal glands, and kidneys appear in normal. Ovaries are normal. Uterus has been removed. Bladder is normal. Surgical clips and staples and mesh fixation device is seen in the lower abdomen and pelvis. No acute osseous abnormality.  Moderate hiatal hernia.  IMPRESSION: Partial small bowel obstruction 16 cm beyond the level of the ligament of Treitz.   Electronically Signed   By: Rozetta Nunnery M.D.   On: 01/16/2014 14:13   Dg Abd 1 View  01/17/2014   CLINICAL DATA:  NG tube placement.  EXAM: ABDOMEN - 1 VIEW  COMPARISON:  Earlier films, same date.  FINDINGS: The NG tube tip is likely in the region of the duodenum bulb. Persistent dilated small bowel loops.  IMPRESSION: NG tube tip likely in the region of the duodenum bulb.   Electronically Signed   By: Kalman Jewels M.D.   On: 01/17/2014 17:23   Abd 1 View (kub)  01/17/2014   CLINICAL DATA:  Vomiting for 2 days.  Small bowel obstruction.  EXAM: ABDOMEN - 1 VIEW  COMPARISON:  11/29/2013 and CT 01/16/2014  FINDINGS: Examination demonstrates apparent contrast throughout the colon. There is interval worsening of a dilated air-filled small bowel loops in the left No free peritoneal air. Multiple surgical clips over the midline pelvis likely due to prior hernia repair. Exam is unchanged.  IMPRESSION: Moderately dilated air-filled small bowel loop in the left upper quadrant increased in diameter compared to the recent CT scan the in measuring 9.4 cm. This is compatible with ongoing proximal early versus partial small bowel obstruction.    Electronically Signed   By: Marin Olp M.D.   On: 01/17/2014 08:48   Dg Abd Portable 1v  01/19/2014   CLINICAL DATA:  Acute onset abdominal pain 3 days ago. Follow-up partial small bowel obstruction. Evaluate nasogastric tube.  EXAM: PORTABLE ABDOMEN - 1 VIEW  COMPARISON:  Portable abdomen x-ray yesterday and 01/17/2014. CT abdomen and pelvis 01/16/2014.  FINDINGS: Bowel gas pattern unremarkable with no evidence of small bowel dilation as noted on the prior CT. Oral contrast material from the prior CT throughout decompressed colon. Nasogastric tube tip projects over the distal stomach. Surgical mesh overlying the pelvis.  IMPRESSION: No acute abdominal abnormality currently. Nasogastric tube tip projects over the distal stomach.  Electronically Signed   By: [name] M.D.   On: 01/19/2014 07:57   Dg Abd Portable 1v  01/18/2014   CLINICAL DATA:  Small bowel obstruction follow-up.  EXAM: PORTABLE ABDOMEN - 1 VIEW  COMPARISON:  01/17/2014 and 01/16/2014  FINDINGS: Nasogastric tube tip is in the duodenum bulb region and unchanged. Again noted is oral contrast in the colon. The gas-filled and distended small bowel loop in the mid abdomen appears to be decompressed. There is now a mildly dilated loop of small bowel in the left upper abdomen. Evidence for surgical mesh in the pelvis.  IMPRESSION: Decreased small bowel gaseous distension in the upper abdomen.  Stable position of the nasogastric tube. Tube is likely in the proximal duodenum.   Electronically Signed   By: Markus Daft M.D.   On: 01/18/2014 08:33     CBC  Recent Labs Lab 01/16/14 1129 01/17/14 0650 01/18/14 0410  WBC 17.7* 11.0* 8.2  HGB 14.3 11.2* 11.5*  HCT 44.2 36.0 37.1  PLT 405* 310 323  MCV 83.6 82.2 83.6  MCH 27.0 25.6* 25.9*  MCHC 32.4 31.1 31.0  RDW 19.4* 19.7* 19.9*    Chemistries   Recent Labs Lab 01/16/14 1129 01/17/14 0650 01/18/14 0410  NA 132* 132* 135  K 4.7 4.2 3.9  CL 100 102 104  CO2 20 24 22     GLUCOSE 113* 83 71  BUN 23 15 15   CREATININE 1.84* 1.15* 1.15*  CALCIUM 10.6* 9.1 9.7  AST 25  --   --   ALT 23  --   --   ALKPHOS 108  --   --   BILITOT 1.2  --   --    ------------------------------------------------------------------------------------------------------------------ estimated creatinine clearance is 67.4 mL/min (by C-G formula based on Cr of 1.15). ------------------------------------------------------------------------------------------------------------------ No results for input(s): HGBA1C in the last 72 hours. ------------------------------------------------------------------------------------------------------------------ No results for input(s): CHOL, HDL, LDLCALC, TRIG, CHOLHDL, LDLDIRECT in the last 72 hours. ------------------------------------------------------------------------------------------------------------------ No results for input(s): TSH, T4TOTAL, T3FREE, THYROIDAB in the last 72 hours.  Invalid input(s): FREET3 ------------------------------------------------------------------------------------------------------------------ No results for input(s): VITAMINB12, FOLATE, FERRITIN, TIBC, IRON, RETICCTPCT in the last 72 hours.  Coagulation profile  Recent Labs Lab 01/16/14 1532 01/17/14 1900 01/18/14 0410 01/19/14 0516 01/20/14 0607  INR 2.33* 2.01* 2.19* 2.16* 2.24*    No results for input(s): DDIMER in the last 72 hours.  Cardiac Enzymes No results for input(s): CKMB, TROPONINI, MYOGLOBIN in the last 168 hours.  Invalid input(s): CK ------------------------------------------------------------------------------------------------------------------ Invalid input(s): POCBNP     Time Spent in minutes   35   Lala Lund K M.D on 01/20/2014 at 10:12 AM  Between 7am to 7pm - Pager - [phone]  After 7pm go to [email or web address] - [facility] Office  [phone]

## 2014-01-20 NOTE — Consult Note (Signed)
[facility] Surgery Consult Note  [name] Oct 21, 1960  [id number].    Requesting MD: [name] Chief Complaint/Reason for Consult: SBO  HPI:  54 y.o. English speaking [place] female with PMH obesity, DM and HTN who underwent a gastric sleeve 10/04/13  [name] at [facility]. She also had an Ex Lap 10/10/13 for SBO, she still has a healing midline wound.  Similar symptoms in November, 2 weeks ago, and upon admission on 01/16/14.  She as pending a follow up appt on 01/18/14, but she missed that appointment.  EDP spoke with Dr. [name] who reported that patient has developed a chronic stricture in her jejunum since November and recommended clear liquids and hydration.  A CT scan of the abdomen and pelvis was done which showed a pSBO.  Leukocytosis on admission.  In addition, patient's INR was therapeutic at 2.66-she has a history of mesenteric thrombosis on chronic Coumadin.  The patient was admitted to the hospitalist service.  Conservative management with NPO, NG tube has gone on for several days and the patient was improving, but over night she began to have N/V again around her NG tube when started back on clear liquds.  She was made NPO and general surgery was consulted regarding in SBO.    ROS: All systems reviewed and otherwise negative except for as above  Family History  Problem Relation Age of Onset  . Diabetes Mother   . Cancer Mother   . Asthma Father     Past Medical History  Diagnosis Date  . Asthma   . Diabetes mellitus without complication   . Hypertension   . Cancer endometrial cancer per patient  . Partial bowel obstruction 01/17/2014    Past Surgical History  Procedure Laterality Date  . Hernia repair    . Abdominal hysterectomy    . Laparoscopic gastric banding  10/04/2013    Social History:  reports that she has never smoked. She has never used smokeless tobacco. She reports that she does not drink alcohol or use illicit  drugs.  Allergies:  Allergies  Allergen Reactions  . Shellfish Allergy Anaphylaxis and Swelling  . Demerol [Meperidine] Hives  . Phenergan [Promethazine Hcl] Hives  . Penicillins Rash    Medications Prior to Admission  Medication Sig Dispense Refill  . albuterol (PROVENTIL HFA;VENTOLIN HFA) 108 (90 BASE) MCG/ACT inhaler Inhale 2 puffs into the lungs every 6 (six) hours as needed for wheezing. 18 g 1  . cholecalciferol (VITAMIN D) 1000 UNITS tablet Take 1,000 Units by mouth daily.    Marland Kitchen EPINEPHrine (EPIPEN) 0.3 mg/0.3 mL SOAJ injection Inject 0.3 mg into the muscle daily as needed (allergic reaction).     . ferrous sulfate 325 (65 FE) MG tablet Take 325 mg by mouth daily with breakfast.    . ondansetron (ZOFRAN-ODT) 4 MG disintegrating tablet Take 4 mg by mouth every 6 (six) hours as needed for nausea or vomiting.    . polyethylene glycol (MIRALAX / GLYCOLAX) packet Take 17 g by mouth daily.    Marland Kitchen warfarin (COUMADIN) 5 MG tablet Take 5 mg by mouth daily.    . [DISCONTINUED] sulfamethoxazole-trimethoprim (BACTRIM DS) 800-160 MG per tablet Take 1 tablet by mouth daily. (Patient not taking: Reported on 01/16/2014) 14 tablet 0    Blood pressure 114/67, pulse 94, temperature 98.6 F (37 C), temperature source Oral, resp. rate 18, height 5\' 5"  (1.651 m), weight 218 lb (98.884 kg), SpO2 94 %. Physical Exam: General: pleasant, WD/WN Hispanic  female who is laying in bed in NAD HEENT: head is normocephalic, atraumatic.  Sclera are noninjected.  PERRL.  Ears and nose without any masses or lesions.  Mouth is pink and moist Heart: regular, rate, and rhythm.  No obvious murmurs, gallops, or rubs noted.  Palpable pedal pulses bilaterally Lungs: CTAB, no wheezes, rhonchi, or rales noted.  Respiratory effort nonlabored Abd: soft, NT/ND, +BS, no masses, hernias, or organomegaly, midline wound nearly healed, small central area of drainage which is clear MS: all 4 extremities are symmetrical with no cyanosis,  clubbing, or edema. Skin: warm and dry with no masses, lesions, or rashes Psych: A&Ox3 with an appropriate affect.   Results for orders placed or performed during the hospital encounter of 01/16/14 (from the past 48 hour(s))  Protime-INR     Status: Abnormal   Collection Time: 01/19/14  5:16 AM  Result Value Ref Range   Prothrombin Time 24.3 (H) 11.6 - 15.2 seconds   INR 2.16 (H) 0.00 - 1.49  Protime-INR     Status: Abnormal   Collection Time: 01/20/14  6:07 AM  Result Value Ref Range   Prothrombin Time 24.9 (H) 11.6 - 15.2 seconds   INR 2.24 (H) 0.00 - 1.49   Dg Abd Portable 1v  01/19/2014   CLINICAL DATA:  Acute onset abdominal pain 3 days ago. Follow-up partial small bowel obstruction. Evaluate nasogastric tube.  EXAM: PORTABLE ABDOMEN - 1 VIEW  COMPARISON:  Portable abdomen x-ray yesterday and 01/17/2014. CT abdomen and pelvis 01/16/2014.  FINDINGS: Bowel gas pattern unremarkable with no evidence of small bowel dilation as noted on the prior CT. Oral contrast material from the prior CT throughout decompressed colon. Nasogastric tube tip projects over the distal stomach. Surgical mesh overlying the pelvis.  IMPRESSION: No acute abdominal abnormality currently. Nasogastric tube tip projects over the distal stomach.   Electronically Signed   By: [name] M.D.   On: 01/19/2014 07:57      Assessment/Plan Recurrent pSBO Nausea/vomiting Inability to take PO ?Chronic jejunal stricture Mesenteric Thrombosis - on coumadin 10/04/13 Gastric sleeve for weight loss - [name] at [facility] 10/10/13 Ex Lap for SBO with LOA with healing midline wound - [name] at [place]: 1.  The patient should be transferred back to [place] for [name] where she's had recent surgery in September and October  2015 as this is likely post operative complications of the surgery.  She apparently has a chronic jejunal stricture.  She is unable  to take PO, but she is stable for transfer. 2.  NG tube, NPO, bowel rest, IVF, pain control, antiemetics 3.  SCD's and on coumadin 4.  Ambulate in halls and IS 5.  No further recommendations, any surgical interventions need to be made by Dr. [name], would recommend transfer to [facility] under his care.   Coralie Keens, [facility] Surgery 01/20/2014, 11:13 AM Pager: [phone]

## 2014-01-21 LAB — BASIC METABOLIC PANEL
Anion gap: 11 (ref 5–15)
BUN: 12 mg/dL (ref 6–23)
CALCIUM: 9.8 mg/dL (ref 8.4–10.5)
CO2: 21 mmol/L (ref 19–32)
CREATININE: 1.02 mg/dL (ref 0.50–1.10)
Chloride: 109 mEq/L (ref 96–112)
GFR calc Af Amer: 73 mL/min — ABNORMAL LOW (ref >90)
GFR calc non Af Amer: 63 mL/min — ABNORMAL LOW (ref >90)
GLUCOSE: 71 mg/dL (ref 70–99)
Potassium: 3.9 mmol/L (ref 3.5–5.1)
SODIUM: 141 mmol/L (ref 135–145)

## 2014-01-21 LAB — CBC
HEMATOCRIT: 37.7 % (ref 36.0–46.0)
HEMOGLOBIN: 11.6 g/dL — AB (ref 12.0–15.0)
MCH: 25.8 pg — ABNORMAL LOW (ref 26.0–34.0)
MCHC: 30.8 g/dL (ref 30.0–36.0)
MCV: 83.8 fL (ref 78.0–100.0)
Platelets: 316 10*3/uL (ref 150–400)
RBC: 4.5 MIL/uL (ref 3.87–5.11)
RDW: 19.8 % — AB (ref 11.5–15.5)
WBC: 7.7 10*3/uL (ref 4.0–10.5)

## 2014-01-21 LAB — PROTIME-INR
INR: 2.79 — AB (ref 0.00–1.49)
Prothrombin Time: 29.7 seconds — ABNORMAL HIGH (ref 11.6–15.2)

## 2014-01-21 MED ORDER — WARFARIN SODIUM 4 MG PO TABS
4.0000 mg | ORAL_TABLET | ORAL | Status: DC
  Filled 2014-01-21: qty 1

## 2014-01-21 NOTE — Discharge Instructions (Signed)
Follow with Primary MD CABEZA,YURI, MD in 2 days   Get CBC, CMP, INR, 2 view Abdominal ray checked  by Primary MD next visit.    Activity: As tolerated with Full fall precautions use walker/cane & assistance as needed   Disposition Home     Diet: Bariatric clear liquid diet.  For Heart failure patients - Check your Weight same time everyday, if you gain over 2 pounds, or you develop in leg swelling, experience more shortness of breath or chest pain, call your Primary MD immediately. Follow Cardiac Low Salt Diet and 1.8 lit/day fluid restriction.   On your next visit with your primary care physician please Get Medicines reviewed and adjusted.   Please request your Prim.MD to go over all Hospital Tests and Procedure/Radiological results at the follow up, please get all Hospital records sent to your Prim MD by signing hospital release before you go home.   If you experience worsening of your admission symptoms, develop shortness of breath, life threatening emergency, suicidal or homicidal thoughts you must seek medical attention immediately by calling 911 or calling your MD immediately  if symptoms less severe.  You Must read complete instructions/literature along with all the possible adverse reactions/side effects for all the Medicines you take and that have been prescribed to you. Take any new Medicines after you have completely understood and accpet all the possible adverse reactions/side effects.   Do not drive, operating heavy machinery, perform activities at heights, swimming or participation in water activities or provide baby sitting services if your were admitted for syncope or siezures until you have seen by Primary MD or a Neurologist and advised to do so again.  Do not drive when taking Pain medications.    Do not take more than prescribed Pain, Sleep and Anxiety Medications  Special Instructions: If you have smoked or chewed Tobacco  in the last 2 yrs please stop smoking,  stop any regular Alcohol  and or any Recreational drug use.  Wear Seat belts while driving.   Please note  You were cared for by a hospitalist during your hospital stay. If you have any questions about your discharge medications or the care you received while you were in the hospital after you are discharged, you can call the unit and asked to speak with the hospitalist on call if the hospitalist that took care of you is not available. Once you are discharged, your primary care physician will handle any further medical issues. Please note that NO REFILLS for any discharge medications will be authorized once you are discharged, as it is imperative that you return to your primary care physician (or establish a relationship with a primary care physician if you do not have one) for your aftercare needs so that they can reassess your need for medications and monitor your lab values.

## 2014-01-21 NOTE — Progress Notes (Signed)
     [name] was admitted to the Hospital on 01/16/2014 and Discharged  01/21/2014 and should be excused from work/school   for 5  days starting 01/16/2014 , may return to work/school without any restrictions.  Call Lala Lund MD, [facility] [phone] with questions.  Thurnell Lose M.D on 01/21/2014,at 11:05 AM  [facility]   Office  [phone]

## 2014-01-21 NOTE — Progress Notes (Signed)
ANTICOAGULATION CONSULT NOTE - Follow-up Consult  Pharmacy Consult:  Coumadin Indication: mesenteric thrombus  Allergies  Allergen Reactions  . Shellfish Allergy Anaphylaxis and Swelling  . Demerol [Meperidine] Hives  . Phenergan [Promethazine Hcl] Hives  . Penicillins Rash    Patient Measurements: Height: 5\' 5"  (165.1 cm) Weight: 218 lb (98.884 kg) IBW/kg (Calculated) : 57  Vital Signs: Temp: 97.9 F (36.6 C) (01/16 0547) Temp Source: Oral (01/16 0547) BP: 132/78 mmHg (01/16 0547) Pulse Rate: 76 (01/16 0547)  Labs:  Recent Labs  01/19/14 0516 01/20/14 0607 01/20/14 1300 01/21/14 0354  HGB  --   --  13.1 11.6*  HCT  --   --  42.2 37.7  PLT  --   --  416* 316  LABPROT 24.3* 24.9*  --  29.7*  INR 2.16* 2.24*  --  2.79*  CREATININE  --   --  1.22* 1.02    Estimated Creatinine Clearance: 76 mL/min (by C-G formula based on Cr of 1.02).    Assessment: 54 yo female on warfarin PTA 5 mg po daily for mesenteric thrombus.  Admitted with N/V from small bowel obstruction. INR today is therapeutic and trending up despite RN charting missed doses due to being NPO on 01/17/14 and 01/18/14.  No bleeding reported.   Goal of Therapy:  INR 2-3    Plan:  - Coumadin 4mg  PO today, give prior to discharge then resume home dose - Daily PT / INR   Vineeth Fell D. Mina Marble, PharmD, BCPS Pager:  [phone] 01/21/2014, 12:46 PM

## 2014-01-21 NOTE — Discharge Summary (Signed)
[name], is a 54 y.o. female  DOB 27-Apr-1960  MRN [id number].  Admission date:  01/16/2014  Admitting Physician  Annita Brod, MD  Discharge Date:  01/21/2014   Primary MD  Yong Channel, MD  Recommendations for primary care physician for things to follow:   Monitor clinically, must follow with primary surgeon within 1-2 days.   Admission Diagnosis  Vomiting [R11.10]   Discharge Diagnosis  Vomiting [R11.10]    Principal Problem:   Partial small bowel obstruction Active Problems:   Dehydration   Hypertension   Diabetes mellitus without complication   Complication of gastric band procedure   Leukocytosis   Nausea with vomiting   ARF (acute renal failure)   Chronic anticoagulation   Mesenteric thrombosis   SBO (small bowel obstruction)      Past Medical History  Diagnosis Date  . Asthma   . Diabetes mellitus without complication   . Hypertension   . Cancer endometrial cancer per patient  . Partial bowel obstruction 01/17/2014    Past Surgical History  Procedure Laterality Date  . Hernia repair  Oct 2006  . Abdominal hysterectomy  2003    Dr. Toney Rakes  . Laparoscopic gastric sleeve resection  10/04/2013    For weight loss surgery - Dr. Laurette Schimke [facility]  . Exploratory laparotomy  10/10/13    SBO - [name] - [facility]       History of present illness and  Hospital Course:     Kindly see H&P for history of present illness and admission details, please review complete Labs, Consult reports and Test reports for all details in brief  HPI  from the history and physical done on the day of admission  [name] is a 54 y.o. female  With past history of obesity, diabetes mellitus and hypertension who underwent a gastric banding in September of this year. Since that  time patient has had 2 prior episodes of nausea and vomiting secondary to partial small bowel obstructions, the first evening in November and the last being 2 weeks ago. She started having symptoms of the same and came into the emergency room today. A CT scan of the abdomen and pelvis was done noting a partial small bowel obstruction. ER spoke with patient's gastric surgeon, Dr. [name] of [place] who reported that patient has developed a chronic stricture in her jejunum since November and recommended clear liquids and hydration. He has a follow up appointment with her on Wednesday 1/13. Breast the patient's labs are noted for a leukocytosis of 17, but no evidence of fever or source of infection. Her creatinine was elevated at 1.84 and baseline is believed to be normal. In addition, patient's INR was therapeutic at 2.66-she has a history of mesenteric thrombosis on chronic Coumadin. Hospitalist will call for further evaluation and admission. Patient started on IV fluids    Hospital Course    1. Recurrent Partial small bowel obstruction - multiple episodes in the past, much improved with conservative treatment,  continue NG tube, bowel rest, repeat x-ray much better this morning. NG was clamped and she was given clear liquids on 11/20/2014, as tolerated bariatric clear liquid diet well, symptom free had a BM last night, I discussed her case with her primary surgeon Dr. Eldridge Abrahams PRA, cell phone number - [phone] on 01-20-14 @ 2.35 pm, he agreed that if patient remains stable she'll be discharged and follow with him in the office in 1-2 days.    2. History of mesenteric artery thrombosis. On Coumadin continue monitoring INR in the outpatient setting.    3. Dehydration and acute renal failure secondary to nausea vomiting from partial small bowel obstruction. Resolved after IV fluids.    4. Essential hypertension. As you him home regimen. Stable.   Discharge Condition: Stable   Follow  UP  Follow-up Information    Follow up with CABEZA,YURI, MD. Schedule an appointment as soon as possible for a visit in 2 days.   Specialty:  Internal Medicine   Contact information:   [address] [place] [address] [phone]       Follow up with Demetrius Revel, MD. Schedule an appointment as soon as possible for a visit in 2 days.   Specialty:  Surgery   Contact information:   [address] [place]  [address] [phone]         Discharge Instructions  and  Discharge Medications     Discharge Instructions    Discharge instructions    Complete by:  As directed   Follow with Primary MD CABEZA,YURI, MD in 2 days   Get CBC, CMP, INR,  2 view Abdominal ray checked  by Primary MD next visit.    Activity: As tolerated with Full fall precautions use walker/cane & assistance as needed   Disposition Home     Diet: Bariatric clear liquid diet.  For Heart failure patients - Check your Weight same time everyday, if you gain over 2 pounds, or you develop in leg swelling, experience more shortness of breath or chest pain, call your Primary MD immediately. Follow Cardiac Low Salt Diet and 1.8 lit/day fluid restriction.   On your next visit with your primary care physician please Get Medicines reviewed and adjusted.   Please request your Prim.MD to go over all Hospital Tests and Procedure/Radiological results at the follow up, please get all Hospital records sent to your Prim MD by signing hospital release before you go home.   If you experience worsening of your admission symptoms, develop shortness of breath, life threatening emergency, suicidal or homicidal thoughts you must seek medical attention immediately by calling 911 or calling your MD immediately  if symptoms less severe.  You Must read complete instructions/literature along with all the possible adverse reactions/side effects for all the Medicines you take and that have been  prescribed to you. Take any new Medicines after you have completely understood and accpet all the possible adverse reactions/side effects.   Do not drive, operating heavy machinery, perform activities at heights, swimming or participation in water activities or provide baby sitting services if your were admitted for syncope or siezures until you have seen by Primary MD or a Neurologist and advised to do so again.  Do not drive when taking Pain medications.    Do not take more than prescribed Pain, Sleep and Anxiety Medications  Special Instructions: If you have smoked or chewed Tobacco  in the last 2 yrs please stop smoking, stop any regular  Alcohol  and or any Recreational drug use.  Wear Seat belts while driving.   Please note  You were cared for by a hospitalist during your hospital stay. If you have any questions about your discharge medications or the care you received while you were in the hospital after you are discharged, you can call the unit and asked to speak with the hospitalist on call if the hospitalist that took care of you is not available. Once you are discharged, your primary care physician will handle any further medical issues. Please note that NO REFILLS for any discharge medications will be authorized once you are discharged, as it is imperative that you return to your primary care physician (or establish a relationship with a primary care physician if you do not have one) for your aftercare needs so that they can reassess your need for medications and monitor your lab values.     Increase activity slowly    Complete by:  As directed             Medication List    TAKE these medications        albuterol 108 (90 BASE) MCG/ACT inhaler  Commonly known as:  PROVENTIL HFA;VENTOLIN HFA  Inhale 2 puffs into the lungs every 6 (six) hours as needed for wheezing.     cholecalciferol 1000 UNITS tablet  Commonly known as:  VITAMIN D  Take 1,000 Units by mouth daily.      EPIPEN 0.3 mg/0.3 mL Soaj injection  Generic drug:  EPINEPHrine  Inject 0.3 mg into the muscle daily as needed (allergic reaction).     ferrous sulfate 325 (65 FE) MG tablet  Take 325 mg by mouth daily with breakfast.     ondansetron 4 MG disintegrating tablet  Commonly known as:  ZOFRAN-ODT  Take 4 mg by mouth every 6 (six) hours as needed for nausea or vomiting.     polyethylene glycol packet  Commonly known as:  MIRALAX / GLYCOLAX  Take 17 g by mouth daily.     warfarin 5 MG tablet  Commonly known as:  COUMADIN  Take 5 mg by mouth daily.          Diet and Activity recommendation: See Discharge Instructions above   Consults obtained - Gen. surgery, patient's primary surgeon over the phone Dr. Eldridge Abrahams PRA   Major procedures and Radiology Reports - PLEASE review detailed and final reports for all details, in brief -    Ct Abdomen Pelvis Wo Contrast  01/16/2014   CLINICAL DATA:  Vomiting.  History of gastric bypass.  EXAM: CT ABDOMEN AND PELVIS WITHOUT CONTRAST  TECHNIQUE: Multidetector CT imaging of the abdomen and pelvis was performed following the standard protocol without IV contrast.  COMPARISON:  11/29/2013 and 10/11/2012 and 10/10/2013  FINDINGS: There is a focal stricture in the proximal jejunum approximately 16 cm beyond the ligament of Treitz. This is in the area of previous inflammation seen on the study of 11/29/2013. There is soft tissue stranding in the adjacent mesenteric.  The small bowel distal to this area is normal. The colon is normal except for a single diverticulum in the proximal sigmoid region.  There is a 4.5 cm lesion in the inferior aspect of the spleen, essentially unchanged since 10/11/2012. This is most likely a benign lesion.  The liver, biliary tree, pancreas, adrenal glands, and kidneys appear in normal. Ovaries are normal. Uterus has been removed. Bladder is normal. Surgical clips and staples and mesh fixation device  is seen in the lower abdomen and  pelvis. No acute osseous abnormality.  Moderate hiatal hernia.  IMPRESSION: Partial small bowel obstruction 16 cm beyond the level of the ligament of Treitz.   Electronically Signed   By: Rozetta Nunnery M.D.   On: 01/16/2014 14:13   Dg Abd 1 View  01/17/2014   CLINICAL DATA:  NG tube placement.  EXAM: ABDOMEN - 1 VIEW  COMPARISON:  Earlier films, same date.  FINDINGS: The NG tube tip is likely in the region of the duodenum bulb. Persistent dilated small bowel loops.  IMPRESSION: NG tube tip likely in the region of the duodenum bulb.   Electronically Signed   By: Kalman Jewels M.D.   On: 01/17/2014 17:23   Abd 1 View (kub)  01/17/2014   CLINICAL DATA:  Vomiting for 2 days.  Small bowel obstruction.  EXAM: ABDOMEN - 1 VIEW  COMPARISON:  11/29/2013 and CT 01/16/2014  FINDINGS: Examination demonstrates apparent contrast throughout the colon. There is interval worsening of a dilated air-filled small bowel loops in the left No free peritoneal air. Multiple surgical clips over the midline pelvis likely due to prior hernia repair. Exam is unchanged.  IMPRESSION: Moderately dilated air-filled small bowel loop in the left upper quadrant increased in diameter compared to the recent CT scan the in measuring 9.4 cm. This is compatible with ongoing proximal early versus partial small bowel obstruction.   Electronically Signed   By: Marin Olp M.D.   On: 01/17/2014 08:48   Dg Abd Portable 1v  01/19/2014   CLINICAL DATA:  Acute onset abdominal pain 3 days ago. Follow-up partial small bowel obstruction. Evaluate nasogastric tube.  EXAM: PORTABLE ABDOMEN - 1 VIEW  COMPARISON:  Portable abdomen x-ray yesterday and 01/17/2014. CT abdomen and pelvis 01/16/2014.  FINDINGS: Bowel gas pattern unremarkable with no evidence of small bowel dilation as noted on the prior CT. Oral contrast material from the prior CT throughout decompressed colon. Nasogastric tube tip projects over the distal stomach. Surgical mesh overlying the  pelvis.  IMPRESSION: No acute abdominal abnormality currently. Nasogastric tube tip projects over the distal stomach.   Electronically Signed   By: [name] M.D.   On: 01/19/2014 07:57   Dg Abd Portable 1v  01/18/2014   CLINICAL DATA:  Small bowel obstruction follow-up.  EXAM: PORTABLE ABDOMEN - 1 VIEW  COMPARISON:  01/17/2014 and 01/16/2014  FINDINGS: Nasogastric tube tip is in the duodenum bulb region and unchanged. Again noted is oral contrast in the colon. The gas-filled and distended small bowel loop in the mid abdomen appears to be decompressed. There is now a mildly dilated loop of small bowel in the left upper abdomen. Evidence for surgical mesh in the pelvis.  IMPRESSION: Decreased small bowel gaseous distension in the upper abdomen.  Stable position of the nasogastric tube. Tube is likely in the proximal duodenum.   Electronically Signed   By: Markus Daft M.D.   On: 01/18/2014 08:33    Micro Results     No results found for this or any previous visit (from the past 240 hour(s)).     Today   Subjective:   [name] today has no headache,no chest abdominal pain,no new weakness tingling or numbness, feels much better wants to go home today.   Objective:   Blood pressure 132/78, pulse 76, temperature 97.9 F (36.6 C), temperature source Oral, resp. rate 17, height 5\' 5"  (1.651 m), weight 98.884 kg (218 lb), SpO2 92 %.  Intake/Output Summary (Last 24 hours) at 01/21/14 1023 Last data filed at 01/21/14 0616  Gross per 24 hour  Intake   1165 ml  Output      0 ml  Net   1165 ml    Exam Awake Alert, Oriented x 3, No new F.N deficits, Normal affect [place].AT,PERRAL Supple Neck,No JVD, No cervical lymphadenopathy appriciated.  Symmetrical Chest wall movement, Good air movement bilaterally, CTAB RRR,No Gallops,Rubs or new Murmurs, No Parasternal Heave +ve B.Sounds, Abd Soft, Non tender, No organomegaly appriciated, No rebound -guarding or rigidity. No Cyanosis,  Clubbing or edema, No new Rash or bruise, left leg in brace and bandage.  Data Review   CBC w Diff: Lab Results  Component Value Date   WBC 7.7 01/21/2014   WBC 12.6* 10/11/2012   HGB 11.6* 01/21/2014   HGB 12.7 10/11/2012   HCT 37.7 01/21/2014   HCT 41.8 10/11/2012   PLT 316 01/21/2014   LYMPHOPCT 34 05/19/2013   MONOPCT 9 05/19/2013   EOSPCT 2 05/19/2013   BASOPCT 0 05/19/2013    CMP: Lab Results  Component Value Date   NA 141 01/21/2014   K 3.9 01/21/2014   CL 109 01/21/2014   CO2 21 01/21/2014   BUN 12 01/21/2014   CREATININE 1.02 01/21/2014   PROT 7.9 01/16/2014   ALBUMIN 3.9 01/16/2014   BILITOT 1.2 01/16/2014   ALKPHOS 108 01/16/2014   AST 25 01/16/2014   ALT 23 01/16/2014  . Lab Results  Component Value Date   INR 2.79* 01/21/2014   INR 2.24* 01/20/2014   INR 2.16* 01/19/2014     Total Time in preparing paper work, data evaluation and todays exam - 35 minutes  Thurnell Lose M.D on 01/21/2014 at 10:23 AM  [facility] Office  [phone]

## 2014-01-21 NOTE — Progress Notes (Signed)
Pt discharged to home accomp by family.  Pt understands importance of FU with surgeon and PCP.  Pt will be on a bari CL diet at home.  No Rx given.  Work note given.

## 2017-10-01 ENCOUNTER — Encounter (HOSPITAL_COMMUNITY): Payer: Self-pay

## 2017-10-01 ENCOUNTER — Emergency Department (HOSPITAL_COMMUNITY)
Admission: EM | Admit: 2017-10-01 | Discharge: 2017-10-01 | Disposition: A | Discharge status: Home / Self Care | Payer: BLUE CROSS/BLUE SHIELD | Attending: Emergency Medicine | Admitting: Emergency Medicine

## 2017-10-01 ENCOUNTER — Emergency Department (HOSPITAL_COMMUNITY): Payer: BLUE CROSS/BLUE SHIELD

## 2017-10-01 ENCOUNTER — Other Ambulatory Visit: Payer: Self-pay

## 2017-10-01 DIAGNOSIS — Z79899 Other long term (current) drug therapy: Secondary | ICD-10-CM | POA: Diagnosis not present

## 2017-10-01 DIAGNOSIS — I10 Essential (primary) hypertension: Secondary | ICD-10-CM | POA: Diagnosis not present

## 2017-10-01 DIAGNOSIS — Z7982 Long term (current) use of aspirin: Secondary | ICD-10-CM | POA: Insufficient documentation

## 2017-10-01 DIAGNOSIS — E119 Type 2 diabetes mellitus without complications: Secondary | ICD-10-CM | POA: Insufficient documentation

## 2017-10-01 DIAGNOSIS — R079 Chest pain, unspecified: Secondary | ICD-10-CM | POA: Insufficient documentation

## 2017-10-01 DIAGNOSIS — J45909 Unspecified asthma, uncomplicated: Secondary | ICD-10-CM | POA: Insufficient documentation

## 2017-10-01 DIAGNOSIS — E785 Hyperlipidemia, unspecified: Secondary | ICD-10-CM | POA: Insufficient documentation

## 2017-10-01 LAB — CBC
HCT: 40.7 % (ref 36.0–46.0)
HEMOGLOBIN: 12.8 g/dL (ref 12.0–15.0)
MCH: 27.9 pg (ref 26.0–34.0)
MCHC: 31.4 g/dL (ref 30.0–36.0)
MCV: 88.9 fL (ref 78.0–100.0)
PLATELETS: 240 10*3/uL (ref 150–400)
RBC: 4.58 MIL/uL (ref 3.87–5.11)
RDW: 15.3 % (ref 11.5–15.5)
WBC: 6.9 10*3/uL (ref 4.0–10.5)

## 2017-10-01 LAB — COMPREHENSIVE METABOLIC PANEL
ALBUMIN: 3.5 g/dL (ref 3.5–5.0)
ALT: 16 U/L (ref 0–44)
AST: 16 U/L (ref 15–41)
Alkaline Phosphatase: 73 U/L (ref 38–126)
Anion gap: 9 (ref 5–15)
BUN: 13 mg/dL (ref 6–20)
CHLORIDE: 104 mmol/L (ref 98–111)
CO2: 25 mmol/L (ref 22–32)
Calcium: 9.2 mg/dL (ref 8.9–10.3)
Creatinine, Ser: 0.63 mg/dL (ref 0.44–1.00)
GFR calc Af Amer: >60 mL/min (ref >60)
GFR calc non Af Amer: >60 mL/min (ref >60)
GLUCOSE: 144 mg/dL — AB (ref 70–99)
Potassium: 3.9 mmol/L (ref 3.5–5.1)
SODIUM: 138 mmol/L (ref 135–145)
Total Bilirubin: 0.6 mg/dL (ref 0.3–1.2)
Total Protein: 7 g/dL (ref 6.5–8.1)

## 2017-10-01 LAB — TROPONIN I
Troponin I: <0.03 ng/mL (ref <0.03)
Troponin I: <0.03 ng/mL (ref <0.03)

## 2017-10-01 LAB — D-DIMER, QUANTITATIVE (NOT AT ARMC): D-Dimer, Quant: 0.54 ug/mL-FEU — ABNORMAL HIGH (ref 0.00–0.50)

## 2017-10-01 MED ORDER — ACETAMINOPHEN 325 MG PO TABS
650.0000 mg | ORAL_TABLET | Freq: Once | ORAL | Status: AC
  Administered 2017-10-01: 650 mg via ORAL
  Filled 2017-10-01: qty 2

## 2017-10-01 NOTE — ED Provider Notes (Signed)
Emergency Department Provider Note   I have reviewed the triage vital signs and the nursing notes.   HISTORY  Chief Complaint Chest Pain   HPI [name] is a 57 y.o. female with a history of asthma, cancer, diabetes, hypertension partial small bowel obstruction and enteric thrombosis after the surgery the presents to the emergency department today with sudden onset of chest pain.  Patient states that started yesterday while her boss was reprimanding her front of her colleagues.  She started having some left and central chest pressure that radiates to her left shoulder.  Not associated with shortness of breath, nausea, vomiting, diaphoresis or lightheadedness.  Pain is improved some since then but has not gone away.  She initially thought it was anxiety and stress from being called out but it persisted throughout the night and today so she came here to get evaluated.  No palpitations today but she did have those yesterday.  No recent fevers or cough.  No recent infections.  No trauma.  No rash. No other associated or modifying symptoms.    Past Medical History:  Diagnosis Date  . Asthma   . Cancer [facility]) endometrial cancer per patient  . Diabetes mellitus without complication ([place])   . Hypertension   . Partial bowel obstruction ([place]) 01/17/2014    Patient Active Problem List   Diagnosis Date Noted  . Dehydration 01/16/2014  . Hypertension 01/16/2014  . Diabetes mellitus without complication ([place]) 53/66/4403  . Complication of gastric band procedure 01/16/2014  . Partial small bowel obstruction ([place]) 01/16/2014  . Leukocytosis 01/16/2014  . Nausea with vomiting 01/16/2014  . ARF (acute renal failure) ([place]) 01/16/2014  . Chronic anticoagulation 01/16/2014  . Mesenteric thrombosis ([place]) 01/16/2014  . SBO (small bowel obstruction) ([place]) 01/16/2014  . URI 01/13/2007  . DIABETES MELLITUS, TYPE II 06/05/2006  . HYPERLIPIDEMIA 06/05/2006  . HYPERTENSION 06/05/2006  . ASTHMA  06/05/2006    Past Surgical History:  Procedure Laterality Date  . ABDOMINAL HYSTERECTOMY  2003   Dr. Toney Rakes  . EXPLORATORY LAPAROTOMY  10/10/13   SBO - Dr. Laurette Schimke [facility] - [facility]  . HERNIA REPAIR  Oct 2006  . LAPAROSCOPIC GASTRIC SLEEVE RESECTION  10/04/2013   For weight loss surgery - [name] [facility]    Current Outpatient Rx  . Order #: [id number] Class: Historical Med  . Order #: [id number] Class: Historical Med  . Order #: [id number] Class: Historical Med  . Order #: [id number] Class: Historical Med  . Order #: [id number] Class: Historical Med  . Order #: [id number] Class: Historical Med    Allergies Shellfish allergy; Demerol [meperidine]; Phenergan [promethazine hcl]; and Penicillins  Family History  Problem Relation Age of Onset  . Diabetes Mother   . Cancer Mother   . Asthma Father     Social History Social History   Tobacco Use  . Smoking status: Never Smoker  . Smokeless tobacco: Never Used  Substance Use Topics  . Alcohol use: No  . Drug use: No    Review of Systems  All other systems negative except as documented in the HPI. All pertinent positives and negatives as reviewed in the HPI. ____________________________________________   PHYSICAL EXAM:  VITAL SIGNS: ED Triage Vitals  Enc Vitals Group     BP 10/01/17 0728 (!) 142/87     Pulse Rate 10/01/17 0728 90     Resp 10/01/17 0728 17     Temp 10/01/17 0728 99.1 F (37.3 C)     Temp Source 10/01/17  3532 Oral     SpO2 10/01/17 0728 95 %     Weight 10/01/17 0726 232 lb (105.2 kg)     Height --      Head Circumference --      Peak Flow --      Pain Score 10/01/17 0725 6     Pain Loc --      Pain Edu? --      Excl. in [place]? --     Constitutional: Alert and oriented. Well appearing and in no acute distress. Eyes: Conjunctivae are normal. PERRL. EOMI. Head: Atraumatic. Nose: No congestion/rhinnorhea. Mouth/Throat: Mucous membranes are moist.  Oropharynx  non-erythematous. Neck: No stridor.  No meningeal signs.   Cardiovascular: Normal rate, regular rhythm. Good peripheral circulation. Grossly normal heart sounds.   Respiratory: Normal respiratory effort.  No retractions. Lungs CTAB. Gastrointestinal: Soft and nontender. No distention.  Musculoskeletal: No lower extremity tenderness nor edema. No gross deformities of extremities. Neurologic:  Normal speech and language. No gross focal neurologic deficits are appreciated.  Skin:  Skin is warm, dry and intact. No rash noted.   ____________________________________________   LABS (all labs ordered are listed, but only abnormal results are displayed)  Labs Reviewed  COMPREHENSIVE METABOLIC PANEL - Abnormal; Notable for the following components:      Result Value   Glucose, Bld 144 (*)    All other components within normal limits  D-DIMER, QUANTITATIVE (NOT AT [facility]) - Abnormal; Notable for the following components:   D-Dimer, Quant 0.54 (*)    All other components within normal limits  CBC  TROPONIN I  TROPONIN I   ____________________________________________  EKG   EKG Interpretation  Date/Time:  Thursday October 01 2017 07:27:33 EDT Ventricular Rate:  90 PR Interval:    QRS Duration: 92 QT Interval:  353 QTC Calculation: 432 R Axis:   48 Text Interpretation:  Sinus rhythm Abnormal R-wave progression, early transition Abnormal inferior Q waves No significant change since last tracing in 2016 Confirmed by Merrily Pew [phone]) on 10/01/2017 7:49:08 AM       ____________________________________________  RADIOLOGY  Dg Chest 2 View  Result Date: 10/01/2017 CLINICAL DATA:  yesterday chest began hurting. Pain is in the center of chest with radiation to left arm EXAM: CHEST - 2 VIEW COMPARISON:  None. FINDINGS: Normal mediastinum and cardiac silhouette. Normal pulmonary vasculature. No evidence of effusion, infiltrate, or pneumothorax. No acute bony abnormality. IMPRESSION: No  acute cardiopulmonary process. Electronically Signed   By: Suzy Bouchard M.D.   On: 10/01/2017 08:44    ____________________________________________   PROCEDURES  Procedure(s) performed:   Procedures   ____________________________________________   INITIAL IMPRESSION / ASSESSMENT AND PLAN / ED COURSE  So that she has a history of a provoked blood clot however we will get a d-dimer secondary to oxygen saturations in 94-96 range on my evaluation.  Normal work-up to evaluate for ACS.  Think is unlikely that she has any other emergent causes at this time such as pneumothorax, pneumonia, asthma exacerbation.  No rashes rating to suggest external causes.  Could just be stress related however will rule out emergencies today.  Workup reassurring.  D-dimer appropriate for her age, doubt pulmonary embolus.  Delta troponin is negative.  We will follow-up with cardiology however things probably likely anxiety/stress related so will need PCP follow up as well.   Clinical Course as of Oct 01 1420  Thu Oct 01, 2017  0940 D-Dimer, Quant(!): 0.54 [JB]    Clinical Course  User Index [JB] Luana Shu, Edmonia Lynch, Student-PA    Pertinent labs & imaging results that were available during my care of the patient were reviewed by me and considered in my medical decision making (see chart for details).  ____________________________________________  FINAL CLINICAL IMPRESSION(S) / ED DIAGNOSES  Final diagnoses:  Nonspecific chest pain     MEDICATIONS GIVEN DURING THIS VISIT:  Medications  acetaminophen (TYLENOL) tablet 650 mg (650 mg Oral Given 10/01/17 1223)     NEW OUTPATIENT MEDICATIONS STARTED DURING THIS VISIT:  New Prescriptions   No medications on file    Note:  This note was prepared with assistance of Dragon voice recognition software. Occasional wrong-word or sound-a-like substitutions may have occurred due to the inherent limitations of voice recognition software.   Merrily Pew,  MD 10/01/17 1422

## 2017-10-01 NOTE — ED Triage Notes (Signed)
Pt reports  That she got into an argument yesterday and chest began hurting. Pain is in the center of chest with radiation to left arm

## 2018-08-22 ENCOUNTER — Emergency Department (HOSPITAL_COMMUNITY): Payer: 59

## 2018-08-22 ENCOUNTER — Encounter (HOSPITAL_COMMUNITY): Payer: Self-pay | Admitting: Emergency Medicine

## 2018-08-22 ENCOUNTER — Other Ambulatory Visit: Payer: Self-pay

## 2018-08-22 ENCOUNTER — Inpatient Hospital Stay (HOSPITAL_COMMUNITY)
Admission: EM | Admit: 2018-08-22 | Discharge: 2018-08-25 | DRG: 176 | Disposition: A | Discharge status: Home / Self Care | Payer: 59 | Attending: Internal Medicine | Admitting: Internal Medicine

## 2018-08-22 DIAGNOSIS — Z6841 Body Mass Index (BMI) 40.0 and over, adult: Secondary | ICD-10-CM

## 2018-08-22 DIAGNOSIS — G4733 Obstructive sleep apnea (adult) (pediatric): Secondary | ICD-10-CM | POA: Diagnosis present

## 2018-08-22 DIAGNOSIS — I82401 Acute embolism and thrombosis of unspecified deep veins of right lower extremity: Secondary | ICD-10-CM | POA: Diagnosis not present

## 2018-08-22 DIAGNOSIS — J45909 Unspecified asthma, uncomplicated: Secondary | ICD-10-CM | POA: Diagnosis present

## 2018-08-22 DIAGNOSIS — I2699 Other pulmonary embolism without acute cor pulmonale: Secondary | ICD-10-CM | POA: Diagnosis not present

## 2018-08-22 DIAGNOSIS — I82461 Acute embolism and thrombosis of right calf muscular vein: Secondary | ICD-10-CM | POA: Diagnosis present

## 2018-08-22 DIAGNOSIS — Z888 Allergy status to other drugs, medicaments and biological substances status: Secondary | ICD-10-CM | POA: Diagnosis not present

## 2018-08-22 DIAGNOSIS — I824Z1 Acute embolism and thrombosis of unspecified deep veins of right distal lower extremity: Secondary | ICD-10-CM | POA: Diagnosis not present

## 2018-08-22 DIAGNOSIS — E1169 Type 2 diabetes mellitus with other specified complication: Secondary | ICD-10-CM | POA: Diagnosis not present

## 2018-08-22 DIAGNOSIS — E119 Type 2 diabetes mellitus without complications: Secondary | ICD-10-CM

## 2018-08-22 DIAGNOSIS — Z8542 Personal history of malignant neoplasm of other parts of uterus: Secondary | ICD-10-CM | POA: Diagnosis not present

## 2018-08-22 DIAGNOSIS — Z91041 Radiographic dye allergy status: Secondary | ICD-10-CM | POA: Diagnosis not present

## 2018-08-22 DIAGNOSIS — Z79899 Other long term (current) drug therapy: Secondary | ICD-10-CM

## 2018-08-22 DIAGNOSIS — I82811 Embolism and thrombosis of superficial veins of right lower extremities: Secondary | ICD-10-CM | POA: Diagnosis present

## 2018-08-22 DIAGNOSIS — Z20828 Contact with and (suspected) exposure to other viral communicable diseases: Secondary | ICD-10-CM | POA: Diagnosis present

## 2018-08-22 DIAGNOSIS — K219 Gastro-esophageal reflux disease without esophagitis: Secondary | ICD-10-CM | POA: Diagnosis present

## 2018-08-22 DIAGNOSIS — F329 Major depressive disorder, single episode, unspecified: Secondary | ICD-10-CM | POA: Diagnosis present

## 2018-08-22 DIAGNOSIS — Z825 Family history of asthma and other chronic lower respiratory diseases: Secondary | ICD-10-CM

## 2018-08-22 DIAGNOSIS — Z9884 Bariatric surgery status: Secondary | ICD-10-CM

## 2018-08-22 DIAGNOSIS — D649 Anemia, unspecified: Secondary | ICD-10-CM | POA: Diagnosis present

## 2018-08-22 DIAGNOSIS — Z86718 Personal history of other venous thrombosis and embolism: Secondary | ICD-10-CM

## 2018-08-22 DIAGNOSIS — Z803 Family history of malignant neoplasm of breast: Secondary | ICD-10-CM

## 2018-08-22 DIAGNOSIS — Z90710 Acquired absence of both cervix and uterus: Secondary | ICD-10-CM | POA: Diagnosis not present

## 2018-08-22 DIAGNOSIS — M7989 Other specified soft tissue disorders: Secondary | ICD-10-CM

## 2018-08-22 DIAGNOSIS — Z833 Family history of diabetes mellitus: Secondary | ICD-10-CM

## 2018-08-22 DIAGNOSIS — Z91013 Allergy to seafood: Secondary | ICD-10-CM

## 2018-08-22 DIAGNOSIS — E118 Type 2 diabetes mellitus with unspecified complications: Secondary | ICD-10-CM | POA: Diagnosis not present

## 2018-08-22 DIAGNOSIS — D6852 Prothrombin gene mutation: Secondary | ICD-10-CM | POA: Diagnosis present

## 2018-08-22 DIAGNOSIS — R0602 Shortness of breath: Secondary | ICD-10-CM | POA: Diagnosis not present

## 2018-08-22 DIAGNOSIS — Z880 Allergy status to penicillin: Secondary | ICD-10-CM | POA: Diagnosis not present

## 2018-08-22 DIAGNOSIS — R52 Pain, unspecified: Secondary | ICD-10-CM

## 2018-08-22 DIAGNOSIS — E669 Obesity, unspecified: Secondary | ICD-10-CM | POA: Diagnosis not present

## 2018-08-22 DIAGNOSIS — I10 Essential (primary) hypertension: Secondary | ICD-10-CM | POA: Diagnosis present

## 2018-08-22 DIAGNOSIS — I50811 Acute right heart failure: Secondary | ICD-10-CM | POA: Diagnosis not present

## 2018-08-22 DIAGNOSIS — Z7982 Long term (current) use of aspirin: Secondary | ICD-10-CM

## 2018-08-22 DIAGNOSIS — C55 Malignant neoplasm of uterus, part unspecified: Secondary | ICD-10-CM

## 2018-08-22 DIAGNOSIS — Z8719 Personal history of other diseases of the digestive system: Secondary | ICD-10-CM

## 2018-08-22 LAB — BASIC METABOLIC PANEL
Anion gap: 15 (ref 5–15)
BUN: 20 mg/dL (ref 6–20)
CO2: 21 mmol/L — ABNORMAL LOW (ref 22–32)
Calcium: 10.6 mg/dL — ABNORMAL HIGH (ref 8.9–10.3)
Chloride: 101 mmol/L (ref 98–111)
Creatinine, Ser: 0.88 mg/dL (ref 0.44–1.00)
GFR calc Af Amer: >60 mL/min (ref >60)
GFR calc non Af Amer: >60 mL/min (ref >60)
Glucose, Bld: 162 mg/dL — ABNORMAL HIGH (ref 70–99)
Potassium: 4.1 mmol/L (ref 3.5–5.1)
Sodium: 137 mmol/L (ref 135–145)

## 2018-08-22 LAB — I-STAT BETA HCG BLOOD, ED (MC, WL, AP ONLY): I-stat hCG, quantitative: <5 m[IU]/mL (ref <5)

## 2018-08-22 LAB — CBC
HCT: 44.5 % (ref 36.0–46.0)
Hemoglobin: 14 g/dL (ref 12.0–15.0)
MCH: 28.6 pg (ref 26.0–34.0)
MCHC: 31.5 g/dL (ref 30.0–36.0)
MCV: 91 fL (ref 80.0–100.0)
Platelets: 250 10*3/uL (ref 150–400)
RBC: 4.89 MIL/uL (ref 3.87–5.11)
RDW: 14.7 % (ref 11.5–15.5)
WBC: 8.7 10*3/uL (ref 4.0–10.5)
nRBC: 0 % (ref 0.0–0.2)

## 2018-08-22 LAB — TROPONIN I (HIGH SENSITIVITY)
Troponin I (High Sensitivity): 28 ng/L — ABNORMAL HIGH (ref <18)
Troponin I (High Sensitivity): 29 ng/L — ABNORMAL HIGH (ref <18)

## 2018-08-22 LAB — GLUCOSE, CAPILLARY: Glucose-Capillary: 138 mg/dL — ABNORMAL HIGH (ref 70–99)

## 2018-08-22 LAB — SARS CORONAVIRUS 2 BY RT PCR (HOSPITAL ORDER, PERFORMED IN ~~LOC~~ HOSPITAL LAB): SARS Coronavirus 2: NEGATIVE

## 2018-08-22 LAB — BRAIN NATRIURETIC PEPTIDE: B Natriuretic Peptide: 313.8 pg/mL — ABNORMAL HIGH (ref 0.0–100.0)

## 2018-08-22 LAB — D-DIMER, QUANTITATIVE: D-Dimer, Quant: 4.89 ug/mL-FEU — ABNORMAL HIGH (ref 0.00–0.50)

## 2018-08-22 LAB — HEPARIN LEVEL (UNFRACTIONATED): Heparin Unfractionated: 0.34 IU/mL (ref 0.30–0.70)

## 2018-08-22 MED ORDER — HYDROCORTISONE NA SUCCINATE PF 250 MG IJ SOLR
200.0000 mg | Freq: Once | INTRAMUSCULAR | Status: AC
  Administered 2018-08-22: 14:00:00 200 mg via INTRAVENOUS
  Filled 2018-08-22: qty 200

## 2018-08-22 MED ORDER — IOHEXOL 350 MG/ML SOLN
75.0000 mL | Freq: Once | INTRAVENOUS | Status: AC | PRN
  Administered 2018-08-22: 19:00:00 75 mL via INTRAVENOUS

## 2018-08-22 MED ORDER — ACETAMINOPHEN 325 MG PO TABS: 650.0000 mg | ORAL_TABLET | ORAL | Status: DC | PRN

## 2018-08-22 MED ORDER — ALBUTEROL SULFATE (2.5 MG/3ML) 0.083% IN NEBU: 3.0000 mL | INHALATION_SOLUTION | Freq: Four times a day (QID) | RESPIRATORY_TRACT | Status: DC | PRN

## 2018-08-22 MED ORDER — DIPHENHYDRAMINE HCL 50 MG/ML IJ SOLN
50.0000 mg | Freq: Once | INTRAMUSCULAR | Status: AC
  Filled 2018-08-22: qty 1

## 2018-08-22 MED ORDER — ASPIRIN EC 81 MG PO TBEC
81.0000 mg | DELAYED_RELEASE_TABLET | Freq: Every day | ORAL | Status: DC
  Administered 2018-08-23 – 2018-08-25 (×3): 81 mg via ORAL
  Filled 2018-08-22 (×3): qty 1

## 2018-08-22 MED ORDER — SODIUM CHLORIDE 0.9% FLUSH: 3.0000 mL | Freq: Once | INTRAVENOUS | Status: DC

## 2018-08-22 MED ORDER — INSULIN ASPART 100 UNIT/ML ~~LOC~~ SOLN
0.0000 [IU] | Freq: Three times a day (TID) | SUBCUTANEOUS | Status: DC
  Administered 2018-08-23: 13:00:00 4 [IU] via SUBCUTANEOUS
  Administered 2018-08-24: 09:00:00 3 [IU] via SUBCUTANEOUS
  Administered 2018-08-24: 0 [IU] via SUBCUTANEOUS
  Administered 2018-08-25 (×2): 4 [IU] via SUBCUTANEOUS

## 2018-08-22 MED ORDER — PANTOPRAZOLE SODIUM 40 MG PO TBEC
40.0000 mg | DELAYED_RELEASE_TABLET | Freq: Every day | ORAL | Status: DC
  Administered 2018-08-23 – 2018-08-25 (×3): 40 mg via ORAL
  Filled 2018-08-22 (×3): qty 1

## 2018-08-22 MED ORDER — DIPHENHYDRAMINE HCL 25 MG PO CAPS
50.0000 mg | ORAL_CAPSULE | Freq: Once | ORAL | Status: AC
  Administered 2018-08-22: 18:00:00 50 mg via ORAL
  Filled 2018-08-22: qty 2

## 2018-08-22 MED ORDER — ESCITALOPRAM OXALATE 10 MG PO TABS
20.0000 mg | ORAL_TABLET | Freq: Every day | ORAL | Status: DC
  Administered 2018-08-23 – 2018-08-25 (×3): 20 mg via ORAL
  Filled 2018-08-22 (×3): qty 2

## 2018-08-22 MED ORDER — CHLORHEXIDINE GLUCONATE CLOTH 2 % EX PADS
6.0000 | MEDICATED_PAD | Freq: Every day | CUTANEOUS | Status: DC
  Administered 2018-08-23: 13:00:00 6 via TOPICAL

## 2018-08-22 MED ORDER — ONDANSETRON 4 MG PO TBDP: 4.0000 mg | ORAL_TABLET | Freq: Four times a day (QID) | ORAL | Status: DC | PRN

## 2018-08-22 MED ORDER — INSULIN ASPART 100 UNIT/ML ~~LOC~~ SOLN: 0.0000 [IU] | Freq: Every day | SUBCUTANEOUS | Status: DC

## 2018-08-22 MED ORDER — HEPARIN (PORCINE) 25000 UT/250ML-% IV SOLN
1700.0000 [IU]/h | INTRAVENOUS | Status: AC
  Administered 2018-08-22 – 2018-08-23 (×3): 1500 [IU]/h via INTRAVENOUS
  Administered 2018-08-24 – 2018-08-25 (×2): 1700 [IU]/h via INTRAVENOUS
  Filled 2018-08-22 (×5): qty 250

## 2018-08-22 MED ORDER — HEPARIN BOLUS VIA INFUSION
5500.0000 [IU] | Freq: Once | INTRAVENOUS | Status: AC
  Administered 2018-08-22: 15:00:00 5500 [IU] via INTRAVENOUS
  Filled 2018-08-22: qty 5500

## 2018-08-22 NOTE — ED Notes (Signed)
Patient transported to CT 

## 2018-08-22 NOTE — ED Triage Notes (Signed)
Onset 2 days ago developed chest pain seen doctor sent to the ED for evaluation for additional symptoms of shortness of breath, fatigue, and bilateral lower extremity edema.

## 2018-08-22 NOTE — Progress Notes (Signed)
VASCULAR LAB PRELIMINARY  PRELIMINARY  PRELIMINARY  PRELIMINARY  Bilateral lower extremity venous duplex completed.    Preliminary report:  See CV proc for preliminary results.   Gave report to Margarita Mail, PA-C   Wen [place], RVT 08/22/2018, 3:08 PM

## 2018-08-22 NOTE — ED Notes (Signed)
Please call pt's cousin to update her on where the patient is going to be moved to and to update on her condition. Anaysa Bisping-Barrezueta at [phone].

## 2018-08-22 NOTE — H&P (Addendum)
NAME:  [name], MRN:  [id number], DOB:  [birth year], LOS: 0 ADMISSION DATE:  08/22/2018, CONSULTATION DATE: 08/22/18 REFERRING MD:  Kenton Kingfisher, CHIEF COMPLAINT:  High risk pulmonary embolism  Brief History   58yF admitted with high risk PE  History of present illness   [name] is a 58 y.o. female who  has a past medical history of asthma, ?precancerous uterine lesion s/p hysterectomy, mesenteric arterial thrombosis previously on warfarin, prothrombin G20210A heterozygote,  She is s/p gastric sleeve, exploratory laparotomy, hernia repair and abdominal hysterectomy. She was in her USOH until 2d prior to admission developed presyncope (this has spontaneously resolved), pleuritic CP and dyspnea on exertion. She went to an urgent care 8/15 for this but left after she'd been waiting for at least 4 hours. Came to [facility] since she was visiting family in the area.   Says she previously took warfarin for 2 years when she was discovered to have mesenteric thrombosis around the time that she had her gastric sleeve done in 2016. She had no issues with ICH or GI bleeding. She denies having had endometrial cancer but wonders if she may have had precancerous lesion. Brother with a blood clot in his 53s. She does have OSA and uses PAP but unsure of her settings.  Past Medical History  Sleeve gastrectomy Endometrial cancer s/p hysterectomy DM Asthma Mesenteric thrombosis  Significant Hospital Events   See tests below  Consults:  Pharmacy for heparin VTE protocol  Procedures:  None  Significant Diagnostic Tests:  CTA Chest with bilateral R>L acute PE with RV/LV ~1.5, mosaic attenuation US DVT with  BNP 313 Trop 28 -> 29 EKG pretty darn close to s1q3t3 pattern Bedside US with TAPSE of just over 2cm, RV appears dilated   Micro Data:  None  Antimicrobials:  None  Interim history/subjective:  Started on heparin gtt in ED  Objective   Blood pressure 133/86, pulse (!) 104, temperature  98.4 F (36.9 C), temperature source Oral, resp. rate 18, height 5\' 5"  (1.651 m), weight 113.9 kg, SpO2 93 %.       No intake or output data in the 24 hours ending 08/22/18 2014 Filed Weights   08/22/18 1027  Weight: 113.9 kg    Examination: Constitutional: She appears well-developed and well-nourished. No distress.  HENT:  Head: Normocephalic.  Right Ear: External ear normal.  Left Ear: External ear normal.  Nose: Nose normal.  Mouth/Throat: Oropharynx is clear and moist.  Eyes: Conjunctivae are normal. Pupils are equal, round, and reactive to light. No scleral icterus.  Neck: Normal range of motion. Neck supple. No thyromegaly present.  Cardiovascular: Normal rate, regular rhythm and normal heart sounds.  Exam reveals no gallop and no friction rub.   No murmur heard. Pulmonary/Chest: Effort normal and breath sounds normal.  Abdomen: obese, soft, NT Lymphadenopathy:    She has no cervical adenopathy.   Resolved Hospital Problem list   NA  Assessment & Plan:  # Pulmonary embolism, DVT: By PESI it is low risk, by sPESI technically high risk only due to history of pre-existing cardiopulmonary disease. Extensive bilateral PE with dilated RV, laboratory and EKG findings as well of RV strain (+BNP, trop, ?s1q3t3). True chronicity of RV finding is unclear however in setting of her sleep-disordered breathing which may be contributory and it is challenging to appreciate wall thickness of RV with ED bedside US to aid in this distinction. 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 - continue UFH for now - would not pursue catheter directed lytics at this time but will continue to observe course closely in ICU for hemodynamic/respiratory deterioration - urgent formal TTE   # Hypercalcemia: - PTH with morning labs   # DM: - SSI - hold metformin  # HTN: - hold home antihypertensives for now  # MDD: - home SSRI  # History of asthma: - prn albuterol  Best practice:  Diet: NPO pMN Pain/Anxiety/Delirium protocol (if indicated): na VAP protocol (if indicated): na DVT prophylaxis: full dose GI prophylaxis: on home treatment Glucose control: SSI Mobility: RN may advance Code Status: Full Family Communication: will update this evening Disposition: ICU  Labs   CBC: Recent Labs  Lab 08/22/18 1115  WBC 8.7  HGB 14.0  HCT 44.5  MCV 91.0  PLT 562    Basic Metabolic Panel: Recent Labs  Lab 08/22/18 1115  NA 137  K 4.1  CL 101  CO2 21*  GLUCOSE 162*  BUN 20  CREATININE 0.88  CALCIUM 10.6*   GFR: Estimated Creatinine Clearance: 89.9 mL/min (by C-G formula based on SCr of 0.88 mg/dL). Recent Labs  Lab 08/22/18 1115  WBC 8.7    Liver Function Tests: No results for input(s): AST, ALT, ALKPHOS, BILITOT, PROT, ALBUMIN in the last 168 hours. No results for input(s): LIPASE, AMYLASE in the last 168 hours. No results for input(s): AMMONIA in the last 168 hours.  ABG No results found for: PHART, PCO2ART, PO2ART, HCO3, TCO2, ACIDBASEDEF, O2SAT   Coagulation Profile: No results for input(s): INR, PROTIME in the last 168 hours.  Cardiac Enzymes: No results for input(s): CKTOTAL, CKMB, CKMBINDEX, TROPONINI in the last 168 hours.  HbA1C: Hgb A1c MFr Bld  Date/Time Value Ref Range Status  01/17/2014 06:50 AM 5.9 (H) <5.7 % Final    Comment:    (NOTE)                                                                       According to the ADA Clinical Practice Recommendations for 2011, when HbA1c is used as a screening test:  >=6.5%   Diagnostic of Diabetes Mellitus           (if abnormal result is confirmed) 5.7-6.4%   Increased risk of developing Diabetes Mellitus References:Diagnosis and Classification of Diabetes Mellitus,Diabetes [id number],9373,42(AJGOT 1):S62-S69 and  Standards of Medical Care in         Diabetes - 2011,Diabetes [id number],6203,55 (Suppl 1):S11-S61.   01/14/2007 09:31 AM 7.1 (H) 4.6 - 6.0 % Final    Comment:    See lab report for associated comment(s)    CBG: No results for input(s): GLUCAP in the last 168 hours.  Review of Systems:   Review of Systems - Negative except chest pain, dyspnea, prior presyncope  Past Medical History  She,  has a past medical history of Asthma, Cancer ([place]) (endometrial cancer per patient), Diabetes mellitus without complication ([place]), Hypertension, and Partial bowel obstruction ([place]) (01/17/2014).   Surgical History    Past Surgical History:  Procedure Laterality Date  . ABDOMINAL HYSTERECTOMY  2003   Dr. Toney Rakes  .  EXPLORATORY LAPAROTOMY  10/10/13   SBO - Dr. Laurette Schimke Teppara - [facility]  . HERNIA REPAIR  Oct 2006  . LAPAROSCOPIC GASTRIC SLEEVE RESECTION  10/04/2013   For weight loss surgery - [name] [facility]     Social History   reports that she has never smoked. She has never used smokeless tobacco. She reports that she does not drink alcohol or use drugs.   Family History   Her family history includes Asthma in her father; Cancer in her mother; Diabetes in her mother.   Allergies Allergies  Allergen Reactions  . Shellfish Allergy Anaphylaxis and Swelling  . Contrast Media [Iodinated Diagnostic Agents]   . Demerol [Meperidine] Hives  . Phenergan [Promethazine Hcl] Hives  . Penicillins Rash     Home Medications  Prior to Admission medications   Medication Sig Start Date End Date Taking? Authorizing Provider  albuterol (VENTOLIN HFA) 108 (90 Base) MCG/ACT inhaler Inhale 2 puffs into the lungs every 6 (six) hours as needed for wheezing or shortness of breath. 01/12/18  Yes [provider]  aspirin EC 81 MG tablet Take by mouth.   Yes [provider]  cholecalciferol (VITAMIN D) 1000 UNITS tablet Take 1,000 Units by mouth daily.   Yes [provider]  EPINEPHrine (EPIPEN) 0.3 mg/0.3 mL SOAJ injection Inject 0.3 mg into the muscle daily as needed (allergic reaction).    Yes [provider]  escitalopram (LEXAPRO) 20 MG tablet Take 20 mg by mouth daily.   Yes [provider]  metFORMIN (GLUCOPHAGE) 500 MG tablet Take 500 mg by mouth daily with breakfast. 04/30/18  Yes [provider]  olmesartan (BENICAR) 20 MG tablet Take 20 mg by mouth daily.  02/26/17  Yes [provider]  omeprazole (PRILOSEC) 20 MG capsule Take by mouth as needed.    Yes [provider]  ondansetron (ZOFRAN-ODT) 4 MG disintegrating tablet Take 4 mg by mouth every 6 (six) hours as needed for nausea or vomiting.   Yes [provider]     Critical care time: This patient is critically ill with high risk pulmonary embolism; requiring frequent high complexity decision making, assessment, support, evaluation, and titration of therapies. This was completed through the application of advanced monitoring technologies and extensive interpretation of multiple databases. During this encounter critical care time was devoted to patient care services described in this note for 45 minutes.

## 2018-08-22 NOTE — Progress Notes (Signed)
[facility] Progress Note Patient Name: EVERLINA GOTTS DOB: May 02, 1960 MRN: [id number]   Date of Service  08/22/2018  HPI/Events of Note  58 year old woman with prior bariatric surgery, prior endometrial cancer, prior mesenteric thrombosis s/p warfarin, now with 2 days of chest pain and shortness of breath. Found to have bilateral PE and seen by bedside ICU admitting team. Hemodynamics stable with MAP 89, HR 91, O2 sat 94 on room air and fully awake, in no distress when I saw her on camera.   eICU Interventions  Bedside notified, admission completed Planned for IV heparin overnight with PTT per protocol, echo, and close monitoring  Please call us if needed     Intervention Category Major Interventions: Respiratory failure - evaluation and management Evaluation Type: New Patient Evaluation  Margaretmary Lombard 08/22/2018, 10:53 PM

## 2018-08-22 NOTE — Progress Notes (Signed)
ANTICOAGULATION CONSULT NOTE - Initial Consult  Pharmacy Consult for heparin Indication: pulmonary embolus  Allergies  Allergen Reactions  . Shellfish Allergy Anaphylaxis and Swelling  . Contrast Media [Iodinated Diagnostic Agents]   . Demerol [Meperidine] Hives  . Phenergan [Promethazine Hcl] Hives  . Penicillins Rash    Patient Measurements: Height: 5\' 5"  (165.1 cm) Weight: 251 lb (113.9 kg) IBW/kg (Calculated) : 57 Heparin Dosing Weight: 84 kg  Vital Signs: Temp: 98.4 F (36.9 C) (08/16 1016) Temp Source: Oral (08/16 1016) BP: 113/76 (08/16 1016) Pulse Rate: 105 (08/16 1016)  Labs: Recent Labs    08/22/18 1115  HGB 14.0  HCT 44.5  PLT 250  CREATININE 0.88  TROPONINIHS 28*    Estimated Creatinine Clearance: 89.9 mL/min (by C-G formula based on SCr of 0.88 mg/dL).   Medical History: Past Medical History:  Diagnosis Date  . Asthma   . Cancer [facility]) endometrial cancer per patient  . Diabetes mellitus without complication ([place])   . Hypertension   . Partial bowel obstruction ([place]) 01/17/2014    Medications:  Scheduled:  . diphenhydrAMINE  50 mg Oral Once   Or  . diphenhydrAMINE  50 mg Intravenous Once  . hydrocortisone sod succinate (SOLU-CORTEF) inj  200 mg Intravenous Once  . sodium chloride flush  3 mL Intravenous Once   Infusions:    Assessment: 49 yoF presents to ED with chest pain. CTA chest ordered. No anticoag PTA. Pharmacy consulted to dose heparin for PE.  CBC wnl, Hgb 14, Plt 250. No bleeding noted. Renal fxn stable.  Goal of Therapy:  Heparin level 0.3-0.7 units/ml Monitor platelets by anticoagulation protocol: Yes   Plan:  Heparin bolus 5500 units Heparin infusion 1500 units/hr Heparin level in 6 hours Monitor daily heparin levels, CBC, and s/sx of bleeding  Berenice Bouton, PharmD PGY1 Pharmacy Resident Office phone: [phone]  08/22/2018,1:40 PM

## 2018-08-22 NOTE — ED Provider Notes (Signed)
[facility] EMERGENCY DEPARTMENT Provider Note   CSN: [id number] Arrival date & time: 08/22/18  1009     History   Chief Complaint Chief Complaint  Patient presents with  . Chest Pain    HPI [name] is a 58 y.o. female who  has a past medical history of Asthma, Cancer ([place]) (endometrial cancer per patient), Diabetes mellitus complication ([place]), Hypertension, Mesenteric Thromwithout bosis,and Partial bowel obstruction ([place]) (01/17/2014). She is s/p gastric sleeve, exploratory laparotomy, hernia repair and abdominal hysterectomy.  The patient presents today with chief complaint of chest pain onset 2 days ago. She has had one month of bilateral lower extremity swelling, worse on the right.  She is developed pain in her right calf.  She is complaining of pain in her retrosternal region.  She states that at rest she has no pain but when she is ambulating she has a deep aching pain with associated pressure radiating into her bilateral shoulders.  She rates her pain at 5 out of 10 with exertion.  She has associated shortness of breath.  She feels sometimes presyncopal she feels like her heart is racing and she gets diaphoresis.  She denies any pleuritic chest pain, fever or chills.  She denies cough.    HPI  Past Medical History:  Diagnosis Date  . Asthma   . Cancer [facility]) endometrial cancer per patient  . Diabetes mellitus without complication ([place])   . Hypertension   . Partial bowel obstruction ([place]) 01/17/2014    Patient Active Problem List   Diagnosis Date Noted  . Dehydration 01/16/2014  . Hypertension 01/16/2014  . Diabetes mellitus without complication ([place]) 62/26/3335  . Complication of gastric band procedure 01/16/2014  . Partial small bowel obstruction ([place]) 01/16/2014  . Leukocytosis 01/16/2014  . Nausea with vomiting 01/16/2014  . ARF (acute renal failure) ([place]) 01/16/2014  . Chronic anticoagulation 01/16/2014  . Mesenteric thrombosis ([place])  01/16/2014  . SBO (small bowel obstruction) ([place]) 01/16/2014  . URI 01/13/2007  . DIABETES MELLITUS, TYPE II 06/05/2006  . HYPERLIPIDEMIA 06/05/2006  . HYPERTENSION 06/05/2006  . ASTHMA 06/05/2006    Past Surgical History:  Procedure Laterality Date  . ABDOMINAL HYSTERECTOMY  2003   Dr. Toney Rakes  . EXPLORATORY LAPAROTOMY  10/10/13   SBO - Dr. Laurette Schimke Teppara - [facility]  . HERNIA REPAIR  Oct 2006  . LAPAROSCOPIC GASTRIC SLEEVE RESECTION  10/04/2013   For weight loss surgery - Dr. Laurette Schimke Teppara [facility]     OB History   No obstetric history on file.      Home Medications    Prior to Admission medications   Medication Sig Start Date End Date Taking? Authorizing Provider  aspirin EC 81 MG tablet Take by mouth.    [provider]  cholecalciferol (VITAMIN D) 1000 UNITS tablet Take 1,000 Units by mouth daily.    [provider]  EPINEPHrine (EPIPEN) 0.3 mg/0.3 mL SOAJ injection Inject 0.3 mg into the muscle daily as needed (allergic reaction).     [provider]  olmesartan (BENICAR) 20 MG tablet Take 20 mg by mouth daily.  02/26/17   [provider]  omeprazole (PRILOSEC) 20 MG capsule Take by mouth as needed.     [provider]  ondansetron (ZOFRAN-ODT) 4 MG disintegrating tablet Take 4 mg by mouth every 6 (six) hours as needed for nausea or vomiting.    [provider]    Family History Family History  Problem Relation Age  of Onset  . Diabetes Mother   . Cancer Mother   . Asthma Father     Social History Social History   Tobacco Use  . Smoking status: Never Smoker  . Smokeless tobacco: Never Used  Substance Use Topics  . Alcohol use: No  . Drug use: No     Allergies   Shellfish allergy, Demerol [meperidine], Phenergan [promethazine hcl], and Penicillins   Review of Systems Review of Systems Ten systems reviewed and are negative for acute change, except as noted in the HPI.    Physical  Exam Updated Vital Signs BP 113/76 (BP Location: Right Arm)   Pulse (!) 105   Temp 98.4 F (36.9 C) (Oral)   Resp 18   Ht 5\' 5"  (1.651 m)   Wt 113.9 kg   SpO2 96%   BMI 41.77 kg/m   Physical Exam Vitals signs and nursing note reviewed.  Constitutional:      General: She is not in acute distress.    Appearance: She is well-developed. She is not diaphoretic.  HENT:     Head: Normocephalic and atraumatic.  Eyes:     General: No scleral icterus.    Conjunctiva/sclera: Conjunctivae normal.  Neck:     Musculoskeletal: Normal range of motion.  Cardiovascular:     Rate and Rhythm: Normal rate and regular rhythm.     Pulses:          Dorsalis pedis pulses are 2+ on the right side and 2+ on the left side.       Posterior tibial pulses are 2+ on the right side and 2+ on the left side.     Heart sounds: Normal heart sounds. No murmur. No friction rub. No gallop.   Pulmonary:     Effort: Pulmonary effort is normal. No respiratory distress.     Breath sounds: Normal breath sounds.  Chest:    Abdominal:     General: Bowel sounds are normal. There is no distension.     Palpations: Abdomen is soft. There is no mass.     Tenderness: There is no abdominal tenderness. There is no guarding.  Musculoskeletal:     Right lower leg: She exhibits tenderness. Edema present.     Left lower leg: Edema present.  Skin:    General: Skin is warm and dry.  Neurological:     Mental Status: She is alert and oriented to person, place, and time.  Psychiatric:        Behavior: Behavior normal.      ED Treatments / Results  Labs (all labs ordered are listed, but only abnormal results are displayed) Labs Reviewed  BASIC METABOLIC PANEL  CBC  I-STAT BETA HCG BLOOD, ED ([facility], [facility], [facility] ONLY)  TROPONIN I (HIGH SENSITIVITY)    EKG None  Radiology Dg Chest 2 View  Result Date: 08/22/2018 CLINICAL DATA:  Chest pain since Friday. Increased heart rate and shortness of breath with walking. EXAM: CHEST  - 2 VIEW COMPARISON:  Chest x-rays dated 10/01/2017 and 10/11/2012. FINDINGS: Heart size and mediastinal contours are grossly stable. Lungs are clear. No pleural effusion or pneumothorax seen. Osseous structures about the chest are unremarkable. IMPRESSION: No active cardiopulmonary disease. No evidence of pneumonia or pulmonary edema. Electronically Signed   By: Franki Cabot M.D.   On: 08/22/2018 11:01    Procedures .Critical Care Performed by: Margarita Mail, PA-C Authorized by: Margarita Mail, PA-C   Critical care provider statement:    Critical care time (  minutes):  50   Critical care was necessary to treat or prevent imminent or life-threatening deterioration of the following conditions:  Cardiac failure and respiratory failure   Critical care was time spent personally by me on the following activities:  Discussions with consultants, evaluation of patient's response to treatment, examination of patient, ordering and performing treatments and interventions, ordering and review of laboratory studies, ordering and review of radiographic studies, pulse oximetry, re-evaluation of patient's condition, obtaining history from patient or surrogate and review of old charts   (including critical care time)  Medications Ordered in ED Medications  sodium chloride flush (NS) 0.9 % injection 3 mL (has no administration in time range)     Initial Impression / Assessment and Plan / ED Course  I have reviewed the triage vital signs and the nursing notes.  Pertinent labs & imaging results that were available during my care of the patient were reviewed by me and considered in my medical decision making (see chart for details).        Patient here for evaluation of right leg pain and chest pain with associated shortness of breath.  History of mesenteric thrombus.  Patient DVT study was positive for gastroc clot.  Patient started immediately on heparin for presumed pulmonary embolus.  There is a  prolonged.  Of time before the study because the patient has a contrast media allergy and had to have prep therapy.  Review of the patient's labs shows a negative corona test, elevated troponin, negative pregnancy test, slightly elevated blood glucose and calcium level, CBC without significant abnormality.  BNP slightly elevated, elevated d-dimer.  After prolonged period of time I got an emergent call from the radiologist because the patient had bilateral large pulmonary emboli in the main pulmonary pulmonary arteries worse on the right with significant heart strain and an RV to LV ratio of 1.5.  I immediately spoke with critical care who will admit the patient.  She has been hemodynamically stable throughout with oxygen saturations above 90% on room air.  No hypotension.  Intermittent increased tachypnea has not reached above the high 20s.  The patient appears comfortable and speaking in full sentences without significant pain.  Patient condition is currently critical and will be admitted to the ICU. Final Clinical Impressions(s) / ED Diagnoses   Final diagnoses:  None    ED Discharge Orders    None       Margarita Mail, PA-C 08/22/18 2148    Lacretia Leigh, MD 08/23/18 2315

## 2018-08-22 NOTE — ED Notes (Signed)
ED TO INPATIENT HANDOFF REPORT  ED Nurse Name and Phone #:  Patty [phone]  S Name/Age/Gender [name] 58 y.o. female Room/Bed: 047C/047C  Code Status   Code Status: Prior  Home/SNF/Other Home Patient oriented to: self, place, time, and event Is this baseline? Yes   Triage Complete: Triage complete  Chief Complaint chest pain  Triage Note Onset 2 days ago developed chest pain seen doctor sent to the ED for evaluation for additional symptoms of shortness of breath, fatigue, and bilateral lower extremity edema.    Allergies Allergies  Allergen Reactions  . Shellfish Allergy Anaphylaxis and Swelling  . Contrast Media [Iodinated Diagnostic Agents]   . Demerol [Meperidine] Hives  . Phenergan [Promethazine Hcl] Hives  . Penicillins Rash    Level of Care/Admitting Diagnosis ED Disposition    None      B Medical/Surgery History Past Medical History:  Diagnosis Date  . Asthma   . Cancer [facility]) endometrial cancer per patient  . Diabetes mellitus without complication ([place])   . Hypertension   . Partial bowel obstruction ([place]) 01/17/2014   Past Surgical History:  Procedure Laterality Date  . ABDOMINAL HYSTERECTOMY  2003   Dr. Toney Rakes  . EXPLORATORY LAPAROTOMY  10/10/13   SBO - Dr. Laurette Schimke [facility] [facility]  . HERNIA REPAIR  Oct 2006  . LAPAROSCOPIC GASTRIC SLEEVE RESECTION  10/04/2013   For weight loss surgery - [name] [facility]     A IV Location/Drains/Wounds Patient Lines/Drains/Airways Status   Active Line/Drains/Airways    Name:   Placement date:   Placement time:   Site:   Days:   Peripheral IV 08/22/18 Right Forearm   08/22/18    1153    Forearm   less than 1   Wound / Incision (Open or Dehisced) 01/16/14 Other (Comment) Abdomen Mid old incisional wound   01/16/14    1746    Abdomen   1679          Intake/Output Last 24 hours No intake or output data in the 24 hours ending 08/22/18 1602  Labs/Imaging Results for  orders placed or performed during the hospital encounter of 08/22/18 (from the past 48 hour(s))  Basic metabolic panel     Status: Abnormal   Collection Time: 08/22/18 11:15 AM  Result Value Ref Range   Sodium 137 135 - 145 mmol/L   Potassium 4.1 3.5 - 5.1 mmol/L   Chloride 101 98 - 111 mmol/L   CO2 21 (L) 22 - 32 mmol/L   Glucose, Bld 162 (H) 70 - 99 mg/dL   BUN 20 6 - 20 mg/dL   Creatinine, Ser 0.88 0.44 - 1.00 mg/dL   Calcium 10.6 (H) 8.9 - 10.3 mg/dL   GFR calc non Af Amer >60 >60 mL/min   GFR calc Af Amer >60 >60 mL/min   Anion gap 15 5 - 15    Comment: Performed at [facility], [place] [address] [place] [address]  CBC     Status: None   Collection Time: 08/22/18 11:15 AM  Result Value Ref Range   WBC 8.7 4.0 - 10.5 K/uL   RBC 4.89 3.87 - 5.11 MIL/uL   Hemoglobin 14.0 12.0 - 15.0 g/dL   HCT 44.5 36.0 - 46.0 %   MCV 91.0 80.0 - 100.0 fL   MCH 28.6 26.0 - 34.0 pg   MCHC 31.5 30.0 - 36.0 g/dL   RDW 14.7 11.5 - 15.5 %  Platelets 250 150 - 400 K/uL   nRBC 0.0 0.0 - 0.2 %    Comment: Performed at [facility], [place] [address], [place], [place] [address]  Troponin I (High Sensitivity)     Status: Abnormal   Collection Time: 08/22/18 11:15 AM  Result Value Ref Range   Troponin I (High Sensitivity) 28 (H) <18 ng/L    Comment: (NOTE) Elevated high sensitivity troponin I (hsTnI) values and significant  changes across serial measurements may suggest ACS but many other  chronic and acute conditions are known to elevate hsTnI results.  Refer to the "Links" section for chest pain algorithms and additional  guidance. Performed at [facility], [place] [address] [place], [place] [address]   Brain natriuretic peptide     Status: Abnormal   Collection Time: 08/22/18 11:15 AM  Result Value Ref Range   B Natriuretic Peptide 313.8 (H) 0.0 - 100.0 pg/mL    Comment: Performed at [facility] [address], [place], [place] [address]  D-dimer, quantitative  (not at [facility])     Status: Abnormal   Collection Time: 08/22/18 11:15 AM  Result Value Ref Range   D-Dimer, Quant 4.89 (H) 0.00 - 0.50 ug/mL-FEU    Comment: (NOTE) At the manufacturer cut-off of 0.50 ug/mL FEU, this assay has been documented to exclude PE with a sensitivity and negative predictive value of 97 to 99%.  At this time, this assay has not been approved by the FDA to exclude DVT/VTE. Results should be correlated with clinical presentation. Performed at [facility], [place] [address], [place], [place] [address]   I-Stat beta hCG blood, ED     Status: None   Collection Time: 08/22/18 11:23 AM  Result Value Ref Range   I-stat hCG, quantitative <5.0 <5 mIU/mL   Comment 3            Comment:   GEST. AGE      CONC.  (mIU/mL)   <=1 WEEK        5 - 50     2 WEEKS       50 - 500     3 WEEKS       100 - 10,000     4 WEEKS     1,000 - 30,000        FEMALE AND NON-PREGNANT FEMALE:     LESS THAN 5 mIU/mL   Troponin I (High Sensitivity)     Status: Abnormal   Collection Time: 08/22/18  2:23 PM  Result Value Ref Range   Troponin I (High Sensitivity) 29 (H) <18 ng/L    Comment: (NOTE) Elevated high sensitivity troponin I (hsTnI) values and significant  changes across serial measurements may suggest ACS but many other  chronic and acute conditions are known to elevate hsTnI results.  Refer to the "Links" section for chest pain algorithms and additional  guidance. Performed at [facility], [place] [address] [place], [place] [address]    Dg Chest 2 View  Result Date: 08/22/2018 CLINICAL DATA:  Chest pain since Friday. Increased heart rate and shortness of breath with walking. EXAM: CHEST - 2 VIEW COMPARISON:  Chest x-rays dated 10/01/2017 and 10/11/2012. FINDINGS: Heart size and mediastinal contours are grossly stable. Lungs are clear. No pleural effusion or pneumothorax seen. Osseous structures about the chest are unremarkable. IMPRESSION: No active cardiopulmonary disease.  No evidence of pneumonia or pulmonary edema. Electronically Signed   By: Cherlynn Kaiser  Enriqueta Shutter M.D.   On: 08/22/2018 11:01   Vas [place] Lower Extremity Venous (dvt) ([place] 7a-7p)  Result Date: 08/22/2018  Lower Venous Study Indications: Pain, and Swelling.  Limitations: Body habitus and patient pain, breath holding, and tensing. Comparison Study: Prior LLE duplex done 05/20/13. Performing Technologist: Sharion Dove RVS  Examination Guidelines: A complete evaluation includes B-mode imaging, spectral Doppler, color Doppler, and power Doppler as needed of all accessible portions of each vessel. Bilateral testing is considered an integral part of a complete examination. Limited examinations for reoccurring indications may be performed as noted.  +---------+---------------+---------+-----------+----------+---------------+ RIGHT    CompressibilityPhasicitySpontaneityPropertiesSummary         +---------+---------------+---------+-----------+----------+---------------+ CFV      Full           Yes      Yes                                  +---------+---------------+---------+-----------+----------+---------------+ FV Prox                                               patent by color +---------+---------------+---------+-----------+----------+---------------+ FV Mid                                                Not visualized  +---------+---------------+---------+-----------+----------+---------------+ FV DistalFull                                                         +---------+---------------+---------+-----------+----------+---------------+ POP      Full                                                         +---------+---------------+---------+-----------+----------+---------------+ Gastroc  None                                         Acute           +---------+---------------+---------+-----------+----------+---------------+ SSV      None                                          Acute           +---------+---------------+---------+-----------+----------+---------------+   +---------+---------------+---------+-----------+----------+---------------+ LEFT     CompressibilityPhasicitySpontaneityPropertiesSummary         +---------+---------------+---------+-----------+----------+---------------+ CFV      Full           Yes      Yes                                  +---------+---------------+---------+-----------+----------+---------------+ FV Prox  patent by color +---------+---------------+---------+-----------+----------+---------------+ FV Mid                                                patent by color +---------+---------------+---------+-----------+----------+---------------+ FV Distal                                             Not visualized  +---------+---------------+---------+-----------+----------+---------------+ PFV                                                   Not visualized  +---------+---------------+---------+-----------+----------+---------------+ POP      Full           Yes      Yes                                  +---------+---------------+---------+-----------+----------+---------------+ PTV      Full                                                         +---------+---------------+---------+-----------+----------+---------------+ PERO                                                  Not visualized  +---------+---------------+---------+-----------+----------+---------------+     Summary: Right: Findings consistent with acute deep vein thrombosis involving the right gastrocnemius veins. Findings consistent with acute superficial vein thrombosis involving the right small saphenous vein. Left: There is no evidence of deep vein thrombosis in the lower extremity. However, portions of this examination were limited- see technologist comments  above.  *See table(s) above for measurements and observations.    Preliminary     Pending Labs Unresulted Labs (From admission, onward)    Start     Ordered   08/23/18 0500  Heparin level (unfractionated)  Daily,   R     08/22/18 1351   08/23/18 0500  CBC  Daily,   R     08/22/18 1351   08/22/18 2100  Heparin level (unfractionated)  Once-Timed,   STAT     08/22/18 1351   08/22/18 1514  SARS Coronavirus 2 [facility] order, Performed in [facility] hospital lab) Nasopharyngeal Nasopharyngeal Swab  (Symptomatic/High Risk of Exposure/Tier 1 Patients Labs with Precautions)  Once,   STAT    Question Answer Comment  Is this test for diagnosis or screening Diagnosis of ill patient   Symptomatic for COVID-19 as defined by CDC Yes   Date of Symptom Onset 08/20/2018   Hospitalized for COVID-19 Yes   Admitted to ICU for COVID-19 No   Previously tested for COVID-19 No   Resident in a congregate (group) care setting No   Employed in healthcare setting No   Pregnant No      08/22/18 1514  Vitals/Pain Today's Vitals   08/22/18 1016 08/22/18 1027 08/22/18 1028 08/22/18 1501  BP: 113/76   113/75  Pulse: (!) 105   90  Resp: 18   16  Temp: 98.4 F (36.9 C)     TempSrc: Oral     SpO2: 96%   97%  Weight:  113.9 kg    Height:  5\' 5"  (1.651 m)    PainSc:   6  0-No pain    Isolation Precautions No active isolations  Medications Medications  sodium chloride flush (NS) 0.9 % injection 3 mL (has no administration in time range)  diphenhydrAMINE (BENADRYL) capsule 50 mg (has no administration in time range)    Or  diphenhydrAMINE (BENADRYL) injection 50 mg (has no administration in time range)  heparin ADULT infusion 100 units/mL (25000 units/210mL sodium chloride 0.45%) (1,500 Units/hr Intravenous New Bag/Given 08/22/18 1455)  hydrocortisone sodium succinate (SOLU-CORTEF) injection 200 mg (200 mg Intravenous Given 08/22/18 1411)  heparin bolus via infusion 5,500 Units (5,500 Units  Intravenous Bolus from Bag 08/22/18 1458)    Mobility walks Low fall risk   Focused Assessments    R Recommendations: See Admitting Provider Note  Report given to:   Additional Notes:  Pt provided meal. Stable and sweet. Denies pain at this time

## 2018-08-22 NOTE — Progress Notes (Signed)
[facility] for heparin Indication: pulmonary embolus  Allergies  Allergen Reactions  . Shellfish Allergy Anaphylaxis and Swelling  . Contrast Media [Iodinated Diagnostic Agents]   . Demerol [Meperidine] Hives  . Phenergan [Promethazine Hcl] Hives  . Penicillins Rash    Patient Measurements: Height: 5\' 5"  (165.1 cm) Weight: 251 lb (113.9 kg) IBW/kg (Calculated) : 57 Heparin Dosing Weight: 84 kg  Vital Signs: BP: 121/83 (08/16 2300) Pulse Rate: 91 (08/16 2300)  Labs: Recent Labs    08/22/18 1115 08/22/18 1423 08/22/18 2304  HGB 14.0  --   --   HCT 44.5  --   --   PLT 250  --   --   HEPARINUNFRC  --   --  0.34  CREATININE 0.88  --   --   TROPONINIHS 28* 29*  --     Estimated Creatinine Clearance: 89.9 mL/min (by C-G formula based on SCr of 0.88 mg/dL).  Assessment: 58 y.o. female with PE for heparin  Goal of Therapy:  Heparin level 0.3-0.7 units/ml Monitor platelets by anticoagulation protocol: Yes   Plan:  Continue Heparin at current rate  Follow-up am labs.   Phillis Knack, PharmD, BCPS  08/22/2018,11:55 PM

## 2018-08-22 NOTE — ED Notes (Signed)
Pt allergic to contrST DYE

## 2018-08-23 ENCOUNTER — Inpatient Hospital Stay (HOSPITAL_COMMUNITY): Payer: 59

## 2018-08-23 DIAGNOSIS — D6859 Other primary thrombophilia: Secondary | ICD-10-CM

## 2018-08-23 DIAGNOSIS — R0902 Hypoxemia: Secondary | ICD-10-CM

## 2018-08-23 DIAGNOSIS — I2699 Other pulmonary embolism without acute cor pulmonale: Principal | ICD-10-CM

## 2018-08-23 DIAGNOSIS — R0602 Shortness of breath: Secondary | ICD-10-CM

## 2018-08-23 LAB — CBC
HCT: 37.9 % (ref 36.0–46.0)
Hemoglobin: 11.8 g/dL — ABNORMAL LOW (ref 12.0–15.0)
MCH: 28.4 pg (ref 26.0–34.0)
MCHC: 31.1 g/dL (ref 30.0–36.0)
MCV: 91.1 fL (ref 80.0–100.0)
Platelets: 229 10*3/uL (ref 150–400)
RBC: 4.16 MIL/uL (ref 3.87–5.11)
RDW: 14.9 % (ref 11.5–15.5)
WBC: 8.4 10*3/uL (ref 4.0–10.5)
nRBC: 0 % (ref 0.0–0.2)

## 2018-08-23 LAB — BASIC METABOLIC PANEL
Anion gap: 8 (ref 5–15)
BUN: 16 mg/dL (ref 6–20)
CO2: 26 mmol/L (ref 22–32)
Calcium: 9.2 mg/dL (ref 8.9–10.3)
Chloride: 105 mmol/L (ref 98–111)
Creatinine, Ser: 0.87 mg/dL (ref 0.44–1.00)
GFR calc Af Amer: >60 mL/min (ref >60)
GFR calc non Af Amer: >60 mL/min (ref >60)
Glucose, Bld: 118 mg/dL — ABNORMAL HIGH (ref 70–99)
Potassium: 3.8 mmol/L (ref 3.5–5.1)
Sodium: 139 mmol/L (ref 135–145)

## 2018-08-23 LAB — GLUCOSE, CAPILLARY
Glucose-Capillary: 100 mg/dL — ABNORMAL HIGH (ref 70–99)
Glucose-Capillary: 158 mg/dL — ABNORMAL HIGH (ref 70–99)
Glucose-Capillary: 111 mg/dL — ABNORMAL HIGH (ref 70–99)
Glucose-Capillary: 142 mg/dL — ABNORMAL HIGH (ref 70–99)

## 2018-08-23 LAB — ECHOCARDIOGRAM COMPLETE
Height: 65 in
Weight: 4049.41 oz

## 2018-08-23 LAB — HIV ANTIBODY (ROUTINE TESTING W REFLEX): HIV Screen 4th Generation wRfx: NONREACTIVE

## 2018-08-23 LAB — MRSA PCR SCREENING: MRSA by PCR: NEGATIVE

## 2018-08-23 LAB — HEPARIN LEVEL (UNFRACTIONATED): Heparin Unfractionated: 0.33 IU/mL (ref 0.30–0.70)

## 2018-08-23 NOTE — H&P (Signed)
NAME:  [name], MRN:  [id number], DOB:  13-Sep-1960, LOS: 1 ADMISSION DATE:  08/22/2018, CONSULTATION DATE: 08/23/18 REFERRING MD:  Kenton Kingfisher, CHIEF COMPLAINT:  High risk pulmonary embolism  Brief History   58yF admitted with high risk PE  History of present illness   [name] is a 58 y.o. female who  has a past medical history of asthma, ?precancerous uterine lesion s/p hysterectomy, mesenteric arterial thrombosis previously on warfarin, prothrombin G20210A heterozygote,  She is s/p gastric sleeve, exploratory laparotomy, hernia repair and abdominal hysterectomy. She was in her USOH until 2d prior to admission developed presyncope (this has spontaneously resolved), pleuritic CP and dyspnea on exertion. She went to an urgent care 8/15 for this but left after she'd been waiting for at least 4 hours. Came to [facility] since she was visiting family in the area.   Says she previously took warfarin for 2 years when she was discovered to have mesenteric thrombosis around the time that she had her gastric sleeve done in 2016. She had no issues with ICH or GI bleeding. She denies having had endometrial cancer but wonders if she may have had precancerous lesion. Brother with a blood clot in his 15s. She does have OSA and uses PAP but unsure of her settings.  Past Medical History  Sleeve gastrectomy Endometrial cancer s/p hysterectomy DM Asthma Mesenteric thrombosis  Significant Hospital Events   See tests below  Consults:  Pharmacy for heparin VTE protocol  Procedures:  None  Significant Diagnostic Tests:  CTA Chest with bilateral R>L acute PE with RV/LV ~1.5, mosaic attenuation US DVT with  BNP 313 Trop 28 -> 29 EKG pretty darn close to [id number] pattern Bedside US with TAPSE of just over 2cm, RV appears dilated   Micro Data:  None  Antimicrobials:  None  Interim history/subjective:  No events overnight, CPAP for OSA overnight  Objective   Blood pressure 127/86, pulse 77,  temperature 98.2 F (36.8 C), temperature source Oral, resp. rate 17, height 5\' 5"  (1.651 m), weight 114.8 kg, SpO2 94 %.        Intake/Output Summary (Last 24 hours) at 08/23/2018 9935 Last data filed at 08/23/2018 0600 Gross per 24 hour  Intake 276.69 ml  Output -  Net 276.69 ml   Filed Weights   08/22/18 1027 08/23/18 0400  Weight: 113.9 kg 114.8 kg   Examination: Constitutional: Well appearing, NAD on RA HENT: [place]/AT, PERRL, EOM-I and MMM Neck: No JVD, -LAN and thyromegally Cardiovascular: RRR, Nl S1/S2 and -M/R/G  Pulmonary/Chest: CTA bilaterally Abdomen: Obese, soft, nT< ND and -BS Ext: -edema and -tenderness  I reviewed chest CT myself, PEs noted.  Resolved Hospital Problem list   NA  Assessment & Plan:  # Pulmonary embolism, DVT: By PESI it is low risk, by sPESI technically high risk only due to history of pre-existing cardiopulmonary disease. Extensive bilateral PE with dilated RV, laboratory and EKG findings as well of RV strain (+BNP, trop negative). True chronicity of RV finding is unclear however in setting of her sleep-disordered breathing which may be contributory and it is challenging to appreciate wall thickness of RV with ED bedside US to aid in this distinction. Hypercoagulability workup in 2017 remarkable for prothrombin G20210A heterozygote, positive lupus anticoagulant x1 which was negative on repeat - not felt at the time by heme at [facility] to have either lupus anticoagulant/APLS.  Discussed with bedside RN. - Full heparin drip - Will need to determine which oral anti-coagulant she is  to be started on life long at this point - No lytics at this point given hemodynamics that are stable and patient saturating well on RA - TEE ordered and pending - Lower ext dopplers done and pending - Monitor in tele  OSA: - CPAP at night  Hypercalcemia: - PTH with morning labs  DM: - SSI - Hold metformin - Start heart healthy carb modified diet  HTN: - Continue to  hold home antihypertensives for now  MDD: - Home SSRI  History of asthma: - PRN albuterol  Transfer to tele and to [facility] service with PCCM off 8/18  Best practice:  Diet: Heart healthy carb modified diet Pain/Anxiety/Delirium protocol (if indicated): na VAP protocol (if indicated): na DVT prophylaxis: full dose GI prophylaxis: on home treatment Glucose control: SSI Mobility: RN may advance Code Status: Full Family Communication: will update this evening Disposition: ICU  Labs   CBC: Recent Labs  Lab 08/22/18 1115 08/23/18 0631  WBC 8.7 8.4  HGB 14.0 11.8*  HCT 44.5 37.9  MCV 91.0 91.1  PLT 250 102    Basic Metabolic Panel: Recent Labs  Lab 08/22/18 1115 08/23/18 0631  NA 137 139  K 4.1 3.8  CL 101 105  CO2 21* 26  GLUCOSE 162* 118*  BUN 20 16  CREATININE 0.88 0.87  CALCIUM 10.6* 9.2   GFR: Estimated Creatinine Clearance: 91.3 mL/min (by C-G formula based on SCr of 0.87 mg/dL). Recent Labs  Lab 08/22/18 1115 08/23/18 0631  WBC 8.7 8.4    Liver Function Tests: No results for input(s): AST, ALT, ALKPHOS, BILITOT, PROT, ALBUMIN in the last 168 hours. No results for input(s): LIPASE, AMYLASE in the last 168 hours. No results for input(s): AMMONIA in the last 168 hours.  ABG No results found for: PHART, PCO2ART, PO2ART, HCO3, TCO2, ACIDBASEDEF, O2SAT   Coagulation Profile: No results for input(s): INR, PROTIME in the last 168 hours.  Cardiac Enzymes: No results for input(s): CKTOTAL, CKMB, CKMBINDEX, TROPONINI in the last 168 hours.  HbA1C: Hgb A1c MFr Bld  Date/Time Value Ref Range Status  01/17/2014 06:50 AM 5.9 (H) <5.7 % Final    Comment:    (NOTE)                                                                       According to the ADA Clinical Practice Recommendations for 2011, when HbA1c is used as a screening test:  >=6.5%   Diagnostic of Diabetes Mellitus           (if abnormal result is confirmed) 5.7-6.4%   Increased risk of  developing Diabetes Mellitus References:Diagnosis and Classification of Diabetes Mellitus,Diabetes HENI,7782,42(PNTIR 1):S62-S69 and Standards of Medical Care in         Diabetes - 2011,Diabetes WERX,5400,86 (Suppl 1):S11-S61.   01/14/2007 09:31 AM 7.1 (H) 4.6 - 6.0 % Final    Comment:    See lab report for associated comment(s)    CBG: Recent Labs  Lab 08/22/18 2253 08/23/18 0718  GLUCAP 138* 100*   Rush Farmer, M.D. [facility] Pulmonary/Critical Care Medicine. Pager: [phone]. After hours pager: [phone].

## 2018-08-23 NOTE — Progress Notes (Signed)
[facility] Progress Note Patient Name: [name] DOB: 14-Jul-1960 MRN: [id number]   Date of Service  08/23/2018  HPI/Events of Note  Request for CPAP order, she uses night CPAP at home as well  eICU Interventions  Order placed     Intervention Category Minor Interventions: Routine modifications to care plan (e.g. PRN medications for pain, fever)  Naviah Belfield G Kathlean Cinco 08/23/2018, 3:03 AM

## 2018-08-23 NOTE — Progress Notes (Signed)
  Echocardiogram 2D Echocardiogram has been performed.  [name] 08/23/2018, 9:00 AM

## 2018-08-23 NOTE — Progress Notes (Signed)
Pt wearing CPAP tolerating well at this time no issues to report.

## 2018-08-23 NOTE — Progress Notes (Signed)
ANTICOAGULATION CONSULT NOTE - Follow Up Consult  Pharmacy Consult for Heparin Indication: pulmonary embolus  Allergies  Allergen Reactions  . Shellfish Allergy Anaphylaxis and Swelling  . Contrast Media [Iodinated Diagnostic Agents]   . Demerol [Meperidine] Hives  . Phenergan [Promethazine Hcl] Hives  . Penicillins Rash    Patient Measurements: Height: 5\' 5"  (165.1 cm) Weight: 253 lb 1.4 oz (114.8 kg) IBW/kg (Calculated) : 57 Heparin Dosing Weight: 84 kg  Vital Signs: Temp: 97.5 F (36.4 C) (08/17 0400) Temp Source: Axillary (08/17 0400) BP: 127/86 (08/17 0700) Pulse Rate: 77 (08/17 0700)  Labs: Recent Labs    08/22/18 1115 08/22/18 1423 08/22/18 2304 08/23/18 0631  HGB 14.0  --   --  11.8*  HCT 44.5  --   --  37.9  PLT 250  --   --  229  HEPARINUNFRC  --   --  0.34 0.33  CREATININE 0.88  --   --  0.87  TROPONINIHS 28* 29*  --   --     Estimated Creatinine Clearance: 91.3 mL/min (by C-G formula based on SCr of 0.87 mg/dL).   Assessment: Anticoag: hep gtt for + BL PE. CTA chest ordered. No AC PTA. HL 0.34>0.33 in goal. Hgb 14>11.8. Plts WNL  Goal of Therapy:  Heparin level 0.3-0.7 units/ml Monitor platelets by anticoagulation protocol: Yes   Plan:  Continue IV heparin at 1500 units/hr Daily HL and CBC    Kourosh Jablonsky S. Alford Highland, PharmD, BCPS Clinical Staff Pharmacist Eilene Ghazi Stillinger 08/23/2018,7:38 AM

## 2018-08-24 ENCOUNTER — Encounter (HOSPITAL_COMMUNITY): Payer: Self-pay | Admitting: General Practice

## 2018-08-24 DIAGNOSIS — E1169 Type 2 diabetes mellitus with other specified complication: Secondary | ICD-10-CM

## 2018-08-24 DIAGNOSIS — I82401 Acute embolism and thrombosis of unspecified deep veins of right lower extremity: Secondary | ICD-10-CM

## 2018-08-24 DIAGNOSIS — I2699 Other pulmonary embolism without acute cor pulmonale: Secondary | ICD-10-CM

## 2018-08-24 DIAGNOSIS — E669 Obesity, unspecified: Secondary | ICD-10-CM

## 2018-08-24 DIAGNOSIS — E119 Type 2 diabetes mellitus without complications: Secondary | ICD-10-CM

## 2018-08-24 DIAGNOSIS — Z8589 Personal history of malignant neoplasm of other organs and systems: Secondary | ICD-10-CM

## 2018-08-24 DIAGNOSIS — D649 Anemia, unspecified: Secondary | ICD-10-CM

## 2018-08-24 HISTORY — DX: Other pulmonary embolism without acute cor pulmonale: I26.99

## 2018-08-24 LAB — CBC
HCT: 36.1 % (ref 36.0–46.0)
Hemoglobin: 11.3 g/dL — ABNORMAL LOW (ref 12.0–15.0)
MCH: 28.3 pg (ref 26.0–34.0)
MCHC: 31.3 g/dL (ref 30.0–36.0)
MCV: 90.3 fL (ref 80.0–100.0)
Platelets: 187 10*3/uL (ref 150–400)
RBC: 4 MIL/uL (ref 3.87–5.11)
RDW: 15 % (ref 11.5–15.5)
WBC: 7 10*3/uL (ref 4.0–10.5)
nRBC: 0 % (ref 0.0–0.2)

## 2018-08-24 LAB — MAGNESIUM: Magnesium: 1.9 mg/dL (ref 1.7–2.4)

## 2018-08-24 LAB — BASIC METABOLIC PANEL
Anion gap: 9 (ref 5–15)
BUN: 19 mg/dL (ref 6–20)
CO2: 22 mmol/L (ref 22–32)
Calcium: 9.1 mg/dL (ref 8.9–10.3)
Chloride: 106 mmol/L (ref 98–111)
Creatinine, Ser: 0.88 mg/dL (ref 0.44–1.00)
GFR calc Af Amer: >60 mL/min (ref >60)
GFR calc non Af Amer: >60 mL/min (ref >60)
Glucose, Bld: 138 mg/dL — ABNORMAL HIGH (ref 70–99)
Potassium: 4.3 mmol/L (ref 3.5–5.1)
Sodium: 137 mmol/L (ref 135–145)

## 2018-08-24 LAB — PHOSPHORUS: Phosphorus: 3.2 mg/dL (ref 2.5–4.6)

## 2018-08-24 LAB — GLUCOSE, CAPILLARY
Glucose-Capillary: 131 mg/dL — ABNORMAL HIGH (ref 70–99)
Glucose-Capillary: 94 mg/dL (ref 70–99)
Glucose-Capillary: 94 mg/dL (ref 70–99)
Glucose-Capillary: 132 mg/dL — ABNORMAL HIGH (ref 70–99)

## 2018-08-24 LAB — PARATHYROID HORMONE, INTACT (NO CA): PTH: 58 pg/mL (ref 15–65)

## 2018-08-24 LAB — HEPARIN LEVEL (UNFRACTIONATED)
Heparin Unfractionated: 0.23 IU/mL — ABNORMAL LOW (ref 0.30–0.70)
Heparin Unfractionated: 0.41 IU/mL (ref 0.30–0.70)

## 2018-08-24 MED ORDER — PREDNISONE 50 MG PO TABS
50.0000 mg | ORAL_TABLET | Freq: Four times a day (QID) | ORAL | Status: AC
  Administered 2018-08-24 – 2018-08-25 (×3): 50 mg via ORAL
  Filled 2018-08-24 (×3): qty 1

## 2018-08-24 MED ORDER — DIPHENHYDRAMINE HCL 25 MG PO CAPS: 50.0000 mg | ORAL_CAPSULE | Freq: Once | ORAL | Status: AC

## 2018-08-24 MED ORDER — HEPARIN BOLUS VIA INFUSION
1500.0000 [IU] | Freq: Once | INTRAVENOUS | Status: AC
  Administered 2018-08-24: 1500 [IU] via INTRAVENOUS
  Filled 2018-08-24: qty 1500

## 2018-08-24 MED ORDER — DIPHENHYDRAMINE HCL 50 MG/ML IJ SOLN
50.0000 mg | Freq: Once | INTRAMUSCULAR | Status: AC
  Administered 2018-08-25: 50 mg via INTRAVENOUS
  Filled 2018-08-24: qty 1

## 2018-08-24 NOTE — Progress Notes (Signed)
ANTICOAGULATION CONSULT NOTE - Follow Up Consult  Pharmacy Consult for Heparin Indication: pulmonary embolus  Allergies  Allergen Reactions  . Shellfish Allergy Anaphylaxis and Swelling  . Contrast Media [Iodinated Diagnostic Agents]   . Demerol [Meperidine] Hives  . Phenergan [Promethazine Hcl] Hives  . Penicillins Rash    Patient Measurements: Height: 5\' 5"  (165.1 cm) Weight: 252 lb 3.2 oz (114.4 kg)(scale b) IBW/kg (Calculated) : 57 Heparin Dosing Weight: 84 kg  Vital Signs: Temp: 98.4 F (36.9 C) (08/18 0728) Temp Source: Oral (08/18 0728) BP: 125/87 (08/18 0728) Pulse Rate: 80 (08/18 0728)  Labs: Recent Labs    08/22/18 1115 08/22/18 1423 08/22/18 2304 08/23/18 0631 08/24/18 0511  HGB 14.0  --   --  11.8* 11.3*  HCT 44.5  --   --  37.9 36.1  PLT 250  --   --  229 187  HEPARINUNFRC  --   --  0.34 0.33 0.23*  CREATININE 0.88  --   --  0.87 0.88  TROPONINIHS 28* 29*  --   --   --     Estimated Creatinine Clearance: 90.2 mL/min (by C-G formula based on SCr of 0.88 mg/dL).   Assessment: Anticoag: Continue heparin for PE.  Heparin level 0.23 (subtherapeutic).  CBC stable.  No bleeding noted.   Goal of Therapy:  Heparin level 0.3-0.7 units/ml Monitor platelets by anticoagulation protocol: Yes   Plan:  Give bolus heparin 1500 units x1.  Increase heparin to 1700 units/hour.  Check heparin level in 6 hours.  Daily HL and CBC  Thank you   Emeline General, PharmD Candidate 08/24/2018,9:59 AM

## 2018-08-24 NOTE — Progress Notes (Signed)
Patient self-manages CPAP machine.  I did put some water in machine for patient and had it ready for her to wear tonight.  No distress noted, will continue to monitor.

## 2018-08-24 NOTE — Progress Notes (Signed)
ANTICOAGULATION CONSULT NOTE - Follow Up Consult  Pharmacy Consult for heparin Indication: pulmonary embolus  Allergies  Allergen Reactions  . Shellfish Allergy Anaphylaxis and Swelling  . Contrast Media [Iodinated Diagnostic Agents]   . Demerol [Meperidine] Hives  . Phenergan [Promethazine Hcl] Hives  . Penicillins Rash    Patient Measurements: Height: 5\' 5"  (165.1 cm) Weight: 252 lb 3.2 oz (114.4 kg)(scale b) IBW/kg (Calculated) : 57 Heparin Dosing Weight: 84 kg  Vital Signs: Temp: 98.5 F (36.9 C) (08/18 1141) Temp Source: Oral (08/18 1141) BP: 117/81 (08/18 1141) Pulse Rate: 76 (08/18 1141)  Labs: Recent Labs    08/22/18 1115 08/22/18 1423  08/23/18 0631 08/24/18 0511 08/24/18 1640  HGB 14.0  --   --  11.8* 11.3*  --   HCT 44.5  --   --  37.9 36.1  --   PLT 250  --   --  229 187  --   HEPARINUNFRC  --   --    < > 0.33 0.23* 0.41  CREATININE 0.88  --   --  0.87 0.88  --   TROPONINIHS 28* 29*  --   --   --   --    < > = values in this interval not displayed.    Estimated Creatinine Clearance: 90.2 mL/min (by C-G formula based on SCr of 0.88 mg/dL).   Medical History: Past Medical History:  Diagnosis Date  . Asthma   . Cancer [facility]) endometrial cancer per patient  . Diabetes mellitus without complication ([place])   . Hypertension   . Partial bowel obstruction ([place]) 01/17/2014  . Pulmonary embolism ([place]) 08/24/2018    Medications:  Scheduled:  . aspirin EC  81 mg Oral Daily  . Chlorhexidine Gluconate Cloth  6 each Topical Daily  . [START ON 08/25/2018] diphenhydrAMINE  50 mg Oral Once   Or  . [START ON 08/25/2018] diphenhydrAMINE  50 mg Intravenous Once  . escitalopram  20 mg Oral Daily  . insulin aspart  0-20 Units Subcutaneous TID WC  . insulin aspart  0-5 Units Subcutaneous QHS  . pantoprazole  40 mg Oral Daily  . predniSONE  50 mg Oral Q6H  . sodium chloride flush  3 mL Intravenous Once   Infusions:  . heparin 1,700 Units/hr (08/24/18 1355)     Assessment: 58 yo F on IV heparin for acute PE and DVT. Not on anticoagulation PTA.  Heparin level is therapeutic at 0.41 on heparin 1700 units/hr. Hgb stable 11.3, pltc wnl. No overt bleeding noted.  Goal of Therapy:  Heparin level 0.3-0.7 units/ml Monitor platelets by anticoagulation protocol: Yes   Plan:  Continue IV heparin at 1700 units/hr Daily heparin level, CBC Monitor for bleeding Follow up oral anticoagulation plans  Vertis Kelch, PharmD PGY2 Cardiology Pharmacy Resident Phone [phone] 08/24/2018       5:40 PM  Please check [email or web address] for unit-specific pharmacist phone numbers

## 2018-08-24 NOTE — Consult Note (Addendum)
[facility]  [phone]) [phone] Fax:(336) [phone]  I have seen the patient, evaluated her, and edited the consult note as follows HEMATOLOGY - INITIAL CONSULTATION  Referral MD: Dr. 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 The patient currently lives in [place], [place].  The patient reported that she developed presyncopal episodes and was seen at an urgent care 3 days prior to admission in [place], [place].  Her presyncopal episodes resolved.  She traveled [place], [place] to visit family where she developed pleuritic chest pain and shortness of breath.  She was seen at a local urgent care on the day of admission advised to come to the emergency room for further evaluation.  In the emergency room, she has CT angiogram of the chest with and without contrast which was positive for acute pulmonary emboli with evidence of right heart strain consistent with early submassive PE.  She also had a Doppler ultrasound of bilateral lower extremities which showed acute DVT involving the right gastrocnemius veins on the right, acute superficial vein thrombosis involving the right small saphenous vein on the right, no evidence of DVT in the left lower extremity.  The patient was started on IV heparin.  She had a 2D echocardiogram performed on 08/23/2018 which showed ejection fraction of 55 to 60%, left ventricular diastolic Doppler parameters are consistent with impaired relaxation, right ventricle has moderately reduced systolic function.  The patient states that she had gastric bypass surgery in  2016 and developed a mesenteric arterial thrombosis postoperatively.  She was placed on Coumadin for anticoagulation and remained on this for approximately 2 years.  During her hospitalization, her work-up was positive for lupus anticoagulant.  She had outpatient follow-up with hematology again in June 2017.  Review of the chart states that since she is heterozygous for prothrombin gene mutation, indefinite anticoagulation was not recommended.  She was told to discontinue her warfarin but to take 81 mg of aspirin daily for prophylaxis.  She has been routinely taking her aspirin without any missed doses.  The patient also notes that she has a brother with a history of venous thrombosis in his early 48s.  The patient states that she also has a history of endometrial cancer diagnosed in the early 2000's.  She had a hysterectomy and did not require any additional treatment for her endometrial cancer.  She states that she is up-to-date with her mammogram and her colonoscopy.  Denies history of tobacco and alcohol use.  When seen today, the patient reports that her chest pain is much better.  She still has a small amount of chest pain with deep inspiration.  She is not currently short of breath sitting in her recliner chair but states that she gets short of breath with exertion.  She denies fevers and chills.  Denies headaches and dizziness.  Denies anorexia and weight loss.  No night sweats.  She denies abdominal pain, nausea, vomiting, constipation, diarrhea.  Denies vaginal bleeding.  Denies epistaxis, hemoptysis, hematemesis, hematuria, melena, hematochezia.  Hematology was asked see the patient to make recommendations regarding anticoagulation for her bilateral pulmonary emboli.    Past Medical History:  Diagnosis Date  . Asthma   .  Cancer [facility]) endometrial cancer per patient  . Diabetes mellitus without complication ([place])   . Hypertension   . Partial bowel obstruction ([place]) 01/17/2014  . Pulmonary  embolism ([place]) 08/24/2018  :  Past Surgical History:  Procedure Laterality Date  . ABDOMINAL HYSTERECTOMY  2003   Dr. Toney Rakes  . EXPLORATORY LAPAROTOMY  10/10/13   SBO - Dr. Laurette Schimke [place] - [facility]  . HERNIA REPAIR  Oct 2006  . LAPAROSCOPIC GASTRIC SLEEVE RESECTION  10/04/2013   For weight loss surgery - [name] [facility]  :  Current Facility-Administered Medications  Medication Dose Route Frequency Provider Last Rate Last Dose  . acetaminophen (TYLENOL) tablet 650 mg  650 mg Oral Q4H PRN Maryjane Hurter, MD      . albuterol (PROVENTIL) (2.5 MG/3ML) 0.083% nebulizer solution 3 mL  3 mL Inhalation Q6H PRN Maryjane Hurter, MD      . aspirin EC tablet 81 mg  81 mg Oral Daily Maryjane Hurter, MD   81 mg at 08/24/18 0913  . Chlorhexidine Gluconate Cloth 2 % PADS 6 each  6 each Topical Daily Maryjane Hurter, MD   Stopped at 08/24/18 [phone]  . escitalopram (LEXAPRO) tablet 20 mg  20 mg Oral Daily Maryjane Hurter, MD   20 mg at 08/24/18 0913  . heparin ADULT infusion 100 units/mL (25000 units/28mL sodium chloride 0.45%)  1,700 Units/hr Intravenous Continuous Raiford Noble [place], DO 17 mL/hr at 08/24/18 1007 1,700 Units/hr at 08/24/18 1007  . heparin bolus via infusion 1,500 Units  1,500 Units Intravenous Once [facility], Omair Latif, DO      . insulin aspart (novoLOG) injection 0-20 Units  0-20 Units Subcutaneous TID WC Maryjane Hurter, MD   3 Units at 08/24/18 [phone]  . insulin aspart (novoLOG) injection 0-5 Units  0-5 Units Subcutaneous QHS Maryjane Hurter, MD      . ondansetron (ZOFRAN-ODT) disintegrating tablet 4 mg  4 mg Oral Q6H PRN Maryjane Hurter, MD      . pantoprazole (PROTONIX) EC tablet 40 mg  40 mg Oral Daily Maryjane Hurter, MD   40 mg at 08/24/18 0913  . sodium chloride flush (NS) 0.9 % injection 3 mL  3 mL Intravenous Once Maryjane Hurter, MD         Allergies  Allergen Reactions  . Shellfish Allergy Anaphylaxis and Swelling  .  Contrast Media [Iodinated Diagnostic Agents]   . Demerol [Meperidine] Hives  . Phenergan [Promethazine Hcl] Hives  . Penicillins Rash  :  Family History  Problem Relation Age of Onset  . Diabetes Mother   . Cancer Mother   . Asthma Father   :  Social History   Socioeconomic History  . Marital status: Divorced    Spouse name: Not on file  . Number of children: Not on file  . Years of education: Not on file  . Highest education level: Not on file  Occupational History  . Not on file  Social Needs  . Financial resource strain: Not on file  . Food insecurity    Worry: Not on file    Inability: Not on file  . Transportation needs    Medical: Not on file    Non-medical: Not on file  Tobacco Use  . Smoking status: Never Smoker  . Smokeless tobacco: Never Used  Substance and Sexual Activity  . Alcohol use: No  . Drug use: No  . Sexual activity: Not on file  Lifestyle  . Physical activity    Days per week: Not on file    Minutes per session: Not on file  . Stress: Not on file  Relationships  . Social Herbalist on phone: Not on file    Gets together: Not on file    Attends religious service: Not on file    Active member of club or organization: Not on file    Attends meetings of clubs or organizations: Not on file    Relationship status: Not on file  . Intimate partner violence    Fear of current or ex partner: Not on file    Emotionally abused: Not on file    Physically abused: Not on file    Forced sexual activity: Not on file  Other Topics Concern  . Not on file  Social History Narrative  . Not on file  :  Review of systems: A comprehensive 14 point review of systems was negative except as noted in the HPI.  Exam: Patient Vitals for the past 24 hrs:  BP Temp Temp src Pulse Resp SpO2 Height Weight  08/24/18 1028 128/90 98.6 F (37 C) Oral 82 18 97 % - -  08/24/18 0728 125/87 98.4 F (36.9 C) Oral 80 17 94 % - -  08/24/18 0535 140/84 98 F  (36.7 C) Oral 75 18 94 % - 252 lb 3.2 oz (114.4 kg)  08/23/18 2346 - - - 88 18 96 % - -  08/23/18 1947 107/69 98.3 F (36.8 C) Oral 86 - 91 % - -  08/23/18 1636 138/82 98.5 F (36.9 C) Oral 85 16 93 % 5\' 5"  (1.651 m) 253 lb (114.8 kg)  08/23/18 1600 114/74 99 F (37.2 C) Oral 81 - 97 % - -  08/23/18 1500 124/86 - - 87 - 96 % - -  08/23/18 1400 128/85 - - 83 (!) 21 95 % - -  08/23/18 1300 117/78 - - 91 15 97 % - -  08/23/18 1200 129/84 - - 89 - 97 % - -  08/23/18 1157 - 98.6 F (37 C) Oral - - - - -    General:  well-nourished in no acute distress.   Eyes:  no scleral icterus.   ENT:  There were no oropharyngeal lesions.   Neck was without thyromegaly.   Lymphatics:  Negative cervical, supraclavicular or axillary adenopathy.   Respiratory: lungs were clear bilaterally without wheezing or crackles.   Cardiovascular:  Regular rate and rhythm, S1/S2, without murmur, rub or gallop.  There was no pedal edema.   GI:  abdomen was soft, flat, nontender, nondistended, without organomegaly.  Muscoloskeletal:  no spinal tenderness of palpation of vertebral spine.   Skin exam was without echymosis, petichae.   Neuro exam was nonfocal. Patient was alert and oriented.  Attention was good.   Language was appropriate.  Mood was normal without depression.  Speech was not pressured.  Thought content was not tangential.     Lab Results  Component Value Date   WBC 7.0 08/24/2018   HGB 11.3 (L) 08/24/2018   HCT 36.1 08/24/2018   PLT 187 08/24/2018   GLUCOSE 138 (H) 08/24/2018   CHOL 186 01/14/2007   TRIG 160 (H) 01/14/2007   HDL 42.1 01/14/2007   LDLDIRECT 173.7 12/08/2005   LDLCALC 112 (H) 01/14/2007   ALT 16 10/01/2017   AST 16 10/01/2017   NA 137 08/24/2018   K 4.3 08/24/2018   CL 106 08/24/2018  CREATININE 0.88 08/24/2018   BUN 19 08/24/2018   CO2 22 08/24/2018  I have personally reviewed her CT imaging.  Abnormal splenic lesion is seen  Dg Chest 2 View  Result Date:  08/22/2018 CLINICAL DATA:  Chest pain since Friday. Increased heart rate and shortness of breath with walking. EXAM: CHEST - 2 VIEW COMPARISON:  Chest x-rays dated 10/01/2017 and 10/11/2012. FINDINGS: Heart size and mediastinal contours are grossly stable. Lungs are clear. No pleural effusion or pneumothorax seen. Osseous structures about the chest are unremarkable. IMPRESSION: No active cardiopulmonary disease. No evidence of pneumonia or pulmonary edema. Electronically Signed   By: Franki Cabot M.D.   On: 08/22/2018 11:01   Ct Angio Chest Pe W Or Wo Contrast  Result Date: 08/22/2018 CLINICAL DATA:  Chest pain. EXAM: CT ANGIOGRAPHY CHEST WITH CONTRAST TECHNIQUE: Multidetector CT imaging of the chest was performed using the standard protocol during bolus administration of intravenous contrast. Multiplanar CT image reconstructions and MIPs were obtained to evaluate the vascular anatomy. CONTRAST:  25mL OMNIPAQUE IOHEXOL 350 MG/ML SOLN COMPARISON:  10/11/2012 FINDINGS: Cardiovascular: Contrast injection is sufficient to demonstrate satisfactory opacification of the pulmonary arteries to the segmental level.There are extensive acute bilateral pulmonary emboli involving the main right and left pulmonary arteries and extending into the lobar, segmental, and subsegmental branches bilaterally. The greatest burden is on the right. There is CT evidence of right heart strain with an RV LV ratio measuring approximately 1.5. the heart size is mildly enlarged. There is no significant pericardial effusion. Mediastinum/Nodes: --No mediastinal or hilar lymphadenopathy. --No axillary lymphadenopathy. --No supraclavicular lymphadenopathy. --Normal thyroid gland. --the esophagus is fluid-filled to the level of the upper thorax. Lungs/Pleura: No pulmonary nodules or masses. No pleural effusion or pneumothorax. No focal airspace consolidation. No focal pleural abnormality. Upper Abdomen: The patient is status post prior sleeve  gastrectomy. There is a moderate-sized hiatal hernia. Again noted is a cystic appearing structure in the spleen measuring approximately 4.5 cm. There is no acute abnormality detected in the upper abdomen. Musculoskeletal: No chest wall abnormality. No acute or significant osseous findings. Review of the MIP images confirms the above findings. IMPRESSION: 1. Positive for acute PE with CT evidence of right heart strain (RV/LV Ratio = 1.5) consistent with at least submassive (intermediate risk) PE. The presence of right heart strain has been associated with an increased risk of morbidity and mortality. Please activate Code PE by paging [phone]. 2. The lungs are essentially clear. 3. Fluid-filled esophagus the level of the upper thorax. This places the patient at risk for aspiration. 4. Status post sleeve gastrectomy. There is a moderate-sized hiatal hernia. These results were called by telephone at the time of interpretation on 08/22/2018 at 6:54 pm to Dr. Margarita Mail , who verbally acknowledged these results. Electronically Signed   By: Constance Holster M.D.   On: 08/22/2018 19:09   Vas [place] Lower Extremity Venous (dvt) ([place] 7a-7p)  Result Date: 08/23/2018  Lower Venous Study Indications: Pain, and Swelling.  Limitations: Body habitus and patient pain, breath holding, and tensing. Comparison Study: Prior LLE duplex done 05/20/13. Performing Technologist: Sharion Dove RVS  Examination Guidelines: A complete evaluation includes B-mode imaging, spectral Doppler, color Doppler, and power Doppler as needed of all accessible portions of each vessel. Bilateral testing is considered an integral part of a complete examination. Limited examinations for reoccurring indications may be performed as noted.  +---------+---------------+---------+-----------+----------+---------------+ RIGHT    CompressibilityPhasicitySpontaneityPropertiesSummary          +---------+---------------+---------+-----------+----------+---------------+  CFV      Full           Yes      Yes                                  +---------+---------------+---------+-----------+----------+---------------+ FV Prox                                               patent by color +---------+---------------+---------+-----------+----------+---------------+ FV Mid                                                Not visualized  +---------+---------------+---------+-----------+----------+---------------+ FV DistalFull                                                         +---------+---------------+---------+-----------+----------+---------------+ POP      Full                                                         +---------+---------------+---------+-----------+----------+---------------+ Gastroc  None                                         Acute           +---------+---------------+---------+-----------+----------+---------------+ SSV      None                                         Acute           +---------+---------------+---------+-----------+----------+---------------+   +---------+---------------+---------+-----------+----------+---------------+ LEFT     CompressibilityPhasicitySpontaneityPropertiesSummary         +---------+---------------+---------+-----------+----------+---------------+ CFV      Full           Yes      Yes                                  +---------+---------------+---------+-----------+----------+---------------+ FV Prox                                               patent by color +---------+---------------+---------+-----------+----------+---------------+ FV Mid                                                patent by color +---------+---------------+---------+-----------+----------+---------------+ FV Distal  Not visualized   +---------+---------------+---------+-----------+----------+---------------+ PFV                                                   Not visualized  +---------+---------------+---------+-----------+----------+---------------+ POP      Full           Yes      Yes                                  +---------+---------------+---------+-----------+----------+---------------+ PTV      Full                                                         +---------+---------------+---------+-----------+----------+---------------+ PERO                                                  Not visualized  +---------+---------------+---------+-----------+----------+---------------+     Summary: Right: Findings consistent with acute deep vein thrombosis involving the right gastrocnemius veins. Findings consistent with acute superficial vein thrombosis involving the right small saphenous vein. Left: There is no evidence of deep vein thrombosis in the lower extremity. However, portions of this examination were limited- see technologist comments above.  *See table(s) above for measurements and observations. Electronically signed by Servando Snare MD on 08/23/2018 at 8:07:11 AM.    Final     Dg Chest 2 View  Result Date: 08/22/2018 CLINICAL DATA:  Chest pain since Friday. Increased heart rate and shortness of breath with walking. EXAM: CHEST - 2 VIEW COMPARISON:  Chest x-rays dated 10/01/2017 and 10/11/2012. FINDINGS: Heart size and mediastinal contours are grossly stable. Lungs are clear. No pleural effusion or pneumothorax seen. Osseous structures about the chest are unremarkable. IMPRESSION: No active cardiopulmonary disease. No evidence of pneumonia or pulmonary edema. Electronically Signed   By: Franki Cabot M.D.   On: 08/22/2018 11:01   Ct Angio Chest Pe W Or Wo Contrast  Result Date: 08/22/2018 CLINICAL DATA:  Chest pain. EXAM: CT ANGIOGRAPHY CHEST WITH CONTRAST TECHNIQUE: Multidetector CT imaging of the  chest was performed using the standard protocol during bolus administration of intravenous contrast. Multiplanar CT image reconstructions and MIPs were obtained to evaluate the vascular anatomy. CONTRAST:  67mL OMNIPAQUE IOHEXOL 350 MG/ML SOLN COMPARISON:  10/11/2012 FINDINGS: Cardiovascular: Contrast injection is sufficient to demonstrate satisfactory opacification of the pulmonary arteries to the segmental level.There are extensive acute bilateral pulmonary emboli involving the main right and left pulmonary arteries and extending into the lobar, segmental, and subsegmental branches bilaterally. The greatest burden is on the right. There is CT evidence of right heart strain with an RV LV ratio measuring approximately 1.5. the heart size is mildly enlarged. There is no significant pericardial effusion. Mediastinum/Nodes: --No mediastinal or hilar lymphadenopathy. --No axillary lymphadenopathy. --No supraclavicular lymphadenopathy. --Normal thyroid gland. --the esophagus is fluid-filled to the level of the upper thorax. Lungs/Pleura: No pulmonary nodules or masses. No pleural effusion or pneumothorax. No focal airspace consolidation. No focal pleural abnormality. Upper Abdomen: The  patient is status post prior sleeve gastrectomy. There is a moderate-sized hiatal hernia. Again noted is a cystic appearing structure in the spleen measuring approximately 4.5 cm. There is no acute abnormality detected in the upper abdomen. Musculoskeletal: No chest wall abnormality. No acute or significant osseous findings. Review of the MIP images confirms the above findings. IMPRESSION: 1. Positive for acute PE with CT evidence of right heart strain (RV/LV Ratio = 1.5) consistent with at least submassive (intermediate risk) PE. The presence of right heart strain has been associated with an increased risk of morbidity and mortality. Please activate Code PE by paging [phone]. 2. The lungs are essentially clear. 3. Fluid-filled  esophagus the level of the upper thorax. This places the patient at risk for aspiration. 4. Status post sleeve gastrectomy. There is a moderate-sized hiatal hernia. These results were called by telephone at the time of interpretation on 08/22/2018 at 6:54 pm to Dr. Margarita Mail , who verbally acknowledged these results. Electronically Signed   By: Constance Holster M.D.   On: 08/22/2018 19:09   Vas [place] Lower Extremity Venous (dvt) ([place] 7a-7p)  Result Date: 08/23/2018  Lower Venous Study Indications: Pain, and Swelling.  Limitations: Body habitus and patient pain, breath holding, and tensing. Comparison Study: Prior LLE duplex done 05/20/13. Performing Technologist: Sharion Dove RVS  Examination Guidelines: A complete evaluation includes B-mode imaging, spectral Doppler, color Doppler, and power Doppler as needed of all accessible portions of each vessel. Bilateral testing is considered an integral part of a complete examination. Limited examinations for reoccurring indications may be performed as noted.  +---------+---------------+---------+-----------+----------+---------------+ RIGHT    CompressibilityPhasicitySpontaneityPropertiesSummary         +---------+---------------+---------+-----------+----------+---------------+ CFV      Full           Yes      Yes                                  +---------+---------------+---------+-----------+----------+---------------+ FV Prox                                               patent by color +---------+---------------+---------+-----------+----------+---------------+ FV Mid                                                Not visualized  +---------+---------------+---------+-----------+----------+---------------+ FV DistalFull                                                         +---------+---------------+---------+-----------+----------+---------------+ POP      Full                                                          +---------+---------------+---------+-----------+----------+---------------+ Gastroc  None  Acute           +---------+---------------+---------+-----------+----------+---------------+ SSV      None                                         Acute           +---------+---------------+---------+-----------+----------+---------------+   +---------+---------------+---------+-----------+----------+---------------+ LEFT     CompressibilityPhasicitySpontaneityPropertiesSummary         +---------+---------------+---------+-----------+----------+---------------+ CFV      Full           Yes      Yes                                  +---------+---------------+---------+-----------+----------+---------------+ FV Prox                                               patent by color +---------+---------------+---------+-----------+----------+---------------+ FV Mid                                                patent by color +---------+---------------+---------+-----------+----------+---------------+ FV Distal                                             Not visualized  +---------+---------------+---------+-----------+----------+---------------+ PFV                                                   Not visualized  +---------+---------------+---------+-----------+----------+---------------+ POP      Full           Yes      Yes                                  +---------+---------------+---------+-----------+----------+---------------+ PTV      Full                                                         +---------+---------------+---------+-----------+----------+---------------+ PERO                                                  Not visualized  +---------+---------------+---------+-----------+----------+---------------+     Summary: Right: Findings consistent with acute deep vein thrombosis involving the  right gastrocnemius veins. Findings consistent with acute superficial vein thrombosis involving the right small saphenous vein. Left: There is no evidence of deep vein thrombosis in the lower extremity. However, portions of this examination were limited- see technologist comments above.  *See table(s) above for measurements and observations. Electronically signed by  Servando Snare MD on 08/23/2018 at 8:07:11 AM.    Final     Assessment and Plan:  Bilateral pulmonary emboli, right leg DVT, history of mesenteric arterial thrombosis Outside records reviewed through Care Everywhere Initial hypercoagulability work-up was positive for lupus anticoagulant, however, she was found to be negative for lupus anticoagulation in 2017 upon repeat of lab work Heterozygous for the prothrombin gene mutation (20210G>A) and negative for factor V Leiden mutation  The patient was previously on warfarin for 2 years after development of a mesenteric thrombosis following gastric bypass surgery followed by Aspirin 81 mg daily with no missed doses Has a first-degree relative with a venous thrombosis The patient has now developed bilateral pulmonary emboli and a right leg DVT Currently on heparin Will need to consider transition of heparin to either warfarin versus DOAC; I recommend minimum 48 hours on IV heparin before transitioning to DOAC Given history of endometrial cancer, I would like to order CT scan of the abdomen and pelvis with IV contrast to rule out cancer recurrence in this hospitalization The patient will need lifelong anticoagulation  History of endometrial cancer Status post hysterectomy in the early 2000's Does not report any symptoms concerning for recurrence However, given significant PE, I recommend CT imaging with contrast She had contrast allergy.  We will premedicate her She has a family history of breast cancer in her mother and personal history of endometrial cancer.  I will try to assess and get  records related to her pathology from uterine cancer.  She would benefit from genetic counseling in the outpatient  Mild anemia Hemoglobin on admission was normal Anemia likely dilutional and due to frequent lab draws No transfusion indicated Monitor closely  Diabetes On sliding scale insulin Management per hospitalist  Hypercalcemia She had mild hypercalcemia on admission which has now normalized PTH was normal Monitor closely  Mikey Bussing, DNP, AGPCNP-BC, AOCNP Heath Lark, MD

## 2018-08-24 NOTE — Progress Notes (Signed)
Called [phone] for CT scanner personell but no answer will inform oncoming shift-per instructions of RPH after first dose of prednisone.

## 2018-08-24 NOTE — Plan of Care (Signed)

## 2018-08-24 NOTE — Progress Notes (Signed)
PROGRESS NOTE    [name]  JYN:[id number] DOB: 11/24/1960 DOA: 08/22/2018 PCP: Kristopher Glee., MD   Brief Narrative:  The patient is a 58 year old morbidly obese female with a past medical history significant for asthma, history of precancerous uterine lesion and endometrial cancer status post hysterectomy, mesenteric arterial thrombosis previously anticoagulated on warfarin as well as a history of prothrombin gene 20210A heterozygote, as well as a history of a sleeve gastrectomy, diabetes mellitus type 2, as well as hernia and other comorbidities who presented to the hospital with a 2-day history of presyncope that resolved, pleuritic chest pain and dyspnea on exertion.  She went to a urgent care on 08/21/2018 but left after she had been waiting for 4 hours and came to the hospital for further evaluation recommendation.  She was worked up and found to have a PE with evidence of right heart strain consistent with early submassive PE and lower extremity Dopplers that showed acute DVT in her right gastrocnemius veins as well as acute superficial vein thrombosis involving the right saphenous vein on the right and no evidence of DVT in the left.  She is started on a heparin drip and critical care was admitted and she is transition from critical care service to [facility].  She had a 2D echocardiogram on 08/23/2019 showed an EF of 55 to 60% and with right ventricular systolic function moderately reduced and left diastolic Doppler parameters are consistent with impaired relaxation.  Her symptoms are improving and Hematology/Oncology was consulted for further evaluation recommendations given her cancer history and hypercoagulable state.  Assessment & Plan:   Active Problems:   PE (pulmonary thromboembolism) (HCC)  Pulmonary Embolism, Right DVT  -By PESI it is low risk, by sPESI technically high risk only due to history of pre-existing cardiopulmonary disease.  -Extensive bilateral PE with dilated RV,  laboratory and EKG findings as well of RV strain (+BNP, trop negative).  -True chronicity of RV finding is unclear however in setting of her sleep-disordered breathing which may be contributory and it is challenging to appreciate wall thickness of RV with ED bedside US to aid in this distinction. -Hypercoagulability workup in 2017 remarkable for prothrombin G20210A heterozygote, positive lupus anticoagulant x1 which was negative on repeat - not felt at the time by heme at [facility] to have either lupus anticoagulant/APLS.   - C/w Full heparin drip for now - Will need to determine which oral anti-coagulant she is to be started on life long at this point and have consulted Hematology/Oncology for further assistance - No lytics at this point given hemodynamics that are stable and patient saturating well on RA - Transthoracic echocardiogram revealed an EF of 55 to 86% with diastolic dysfunction as well as a right ventricle has a moderately reduced systolic function - Lower ext dopplers done and were positive for DVT in the right Leg -Initial lupus anticoagulant was positive in 2017 but second repeat was negative and repeat lab work was heterozygous for the prothrombin gene mutation (G20210A) negative for factor V Leiden mutation -Currently has been previously on Coumadin and now on aspirin 81 mg -Dr. Simeon Craft such recommends transitioning heparin to either warfarin or direct and recommends a minimum of 48 hours of IV heparin before transitioning to direct -Given her endometrial cancer history she is ordered a CT of the abdomen pelvis with IV contrast to rule out cancer recurrence in this hospitalization -Continue monitor and follow -PT and OT were consulted for further evaluation recommending no follow-up  OSA -  CPAP at night  Hypercalcemia -Improved and calcium level is now 9.1 -PTH intact was 58  DM -Continue resistant sliding scale insulin before meals and at bedtime - Hold metformin - Start heart  healthy carb modified diet -CBGs have been ranging from 94-1 42  HTN - Continue to hold home antihypertensives for now  MDD - Home SSRI with escitalopram 20 mg p.o. daily  History of Asthma - PRN albuterol  Normocytic Anemia -Patient hemoglobin/hematocrit was 11.3/36.1 -Check anemia panel in the a.m. -Continue to monitor for signs and symptoms of bleeding while she is anticoagulated on a heparin drip; currently no overt bleeding noted -Repeat CBC in the a.m.  GERD -Continue with pantoprazole 40 mg p.o. daily  History of Mesenteric Arterial Thrombus  -Previously was anticoagulated with Coumadin but no longer on Coumadin now -Currently on a heparin drip as above  History of endometrial cancer -Status post hysterectomy -Medical oncology was consulted and they are recommending CT of the abdomen pelvis with contrast -Dr. Alvy Bimler is a patient will benefit from genetic counseling  Morbid Obesity -Estimated body mass index is 41.97 kg/m as calculated from the following:   Height as of this encounter: 5\' 5"  (1.651 m).   Weight as of this encounter: 114.4 kg. -Weight Loss and Dietary Counseling given   DVT prophylaxis: Anticoagulated with a heparin drip Code Status: FULL CODE Family Communication: No family present at bedside Disposition Plan: Anticipate discharge home in the next 24 to 48 hours pending clearance by Hematology/Oncology  Consultants:   PCCM Transfer  Hematology/Oncology    Procedures:  ECHOCARDIOGRAM  IMPRESSIONS    1. The left ventricle has normal systolic function, with an ejection fraction of 55-60%. The cavity size was normal. Left ventricular diastolic Doppler parameters are consistent with impaired relaxation.  2. The right ventricle has moderately reduced systolic function. The cavity was mildly enlarged. There is no increase in right ventricular wall thickness.  3. RV free wall is challenging to visualize but mid wall appears hypokinetic.  Apical sparing noted (McConnell's sign).  4. The aorta is normal unless otherwise noted.  FINDINGS  Left Ventricle: The left ventricle has normal systolic function, with an ejection fraction of 55-60%. The cavity size was normal. There is no increase in left ventricular wall thickness. Left ventricular diastolic Doppler parameters are consistent with  impaired relaxation.  Right Ventricle: The right ventricle has moderately reduced systolic function. The cavity was mildly enlarged. There is no increase in right ventricular wall thickness. RV free wall is challenging to visualize but mid wall appears hypokinetic. Apical  sparing noted (McConnell's sign).  Left Atrium: Left atrial size was normal in size.  Right Atrium: Right atrial size was normal in size. Right atrial pressure is estimated at 3 mmHg.  Interatrial Septum: No atrial level shunt detected by color flow Doppler.  Pericardium: There is no evidence of pericardial effusion.  Mitral Valve: The mitral valve is normal in structure. Mitral valve regurgitation is not visualized by color flow Doppler.  Tricuspid Valve: The tricuspid valve is normal in structure. Tricuspid valve regurgitation is trivial by color flow Doppler.  Aortic Valve: The aortic valve is normal in structure. Aortic valve regurgitation was not visualized by color flow Doppler. There is no evidence of aortic valve stenosis.  Pulmonic Valve: The pulmonic valve was normal in structure. Pulmonic valve regurgitation is not visualized by color flow Doppler.  Aorta: The aorta is normal unless otherwise noted.  Venous: The inferior vena cava is normal in size  with greater than 50% respiratory variability.    +--------------+--------++  LEFT VENTRICLE            +----------------+----------++ +--------------+--------++  Diastology                     PLAX 2D                   +----------------+----------++ +--------------+--------++  LV e' lateral:    14.50 cm/s    LVIDd:         4.20 cm    +----------------+----------++ +--------------+--------++  LV E/e' lateral: 3.7           LVIDs:         3.00 cm    +----------------+----------++ +--------------+--------++  LV e' medial:    9.14 cm/s     LV PW:         1.10 cm    +----------------+----------++ +--------------+--------++  LV E/e' medial:  5.8           LV IVS:        1.00 cm    +----------------+----------++ +--------------+--------++  LVOT diam:     1.90 cm    +--------------+--------++  LV SV:         44 ml      +--------------+--------++  LV SV Index:   18.51      +--------------+--------++  LVOT Area:     2.84 cm   +--------------+--------++                            +--------------+--------++  +---------------+----------++  RIGHT VENTRICLE              +---------------+----------++  RV Basal diam:  3.80 cm      +---------------+----------++  RV S prime:     11.10 cm/s   +---------------+----------++  TAPSE (M-mode): 1.9 cm       +---------------+----------++  +---------------+-------++-----------++  LEFT ATRIUM              Index         +---------------+-------++-----------++  LA diam:        2.80 cm  1.28 cm/m    +---------------+-------++-----------++  LA Vol (A2C):   43.2 ml  19.76 ml/m   +---------------+-------++-----------++  LA Vol (A4C):   33.4 ml  15.28 ml/m   +---------------+-------++-----------++  LA Biplane Vol: 40.0 ml  18.30 ml/m   +---------------+-------++-----------++ +------------+---------++-----------++  RIGHT ATRIUM            Index         +------------+---------++-----------++  RA Area:     15.70 cm                +------------+---------++-----------++  RA Volume:   42.90 ml   19.62 ml/m   +------------+---------++-----------++  +------------+-----------++  AORTIC VALVE               +------------+-----------++  LVOT Vmax:   94.60 cm/s    +------------+-----------++  LVOT Vmean:  63.500  cm/s   +------------+-----------++  LVOT VTI:    0.175 m       +------------+-----------++   +-------------+-------++  AORTA                   +-------------+-------++  Ao Root diam: 3.10 cm   +-------------+-------++  +--------------+-----------++ +---------------+-----------++  MITRAL VALVE                  TRICUSPID VALVE               +--------------+-----------++ +---------------+-----------++  MV Area (PHT): 2.03 cm       TR Peak grad:   30.0 mmHg     +--------------+-----------++ +---------------+-----------++  MV PHT:        108.17 msec    TR Vmax:        274.00 cm/s   +--------------+-----------++ +---------------+-----------++  MV Decel Time: 373 msec      +--------------+-----------++ +--------------+-------+ +--------------+----------++   SHUNTS                   MV E velocity: 53.10 cm/s    +--------------+-------+ +--------------+----------++   Systemic VTI:  0.18 m    MV A velocity: 66.00 cm/s    +--------------+-------+ +--------------+----------++   Systemic Diam: 1.90 cm   MV E/A ratio:  0.80          +--------------+-------+ +--------------+----------++  LE DUPLEX  Right: Findings consistent with acute deep vein thrombosis involving the right gastrocnemius veins. Findings consistent with acute superficial vein thrombosis involving the right small saphenous vein. Left: There is no evidence of deep vein thrombosis in the lower extremity. However, portions of this examination were limited- see technologist comments above.   Antimicrobials:  Anti-infectives (From admission, onward)   None     Subjective: Seen and examined at bedside states that she is doing fairly well today.  Denies chest pain, lightheadedness or dizziness.  No nausea or vomiting but still had some chest pressure with inspiration.  No other concerns or plans at this time and understand that she will likely need to have lifelong anticoagulation.  Objective: Vitals:   08/23/18 2346 08/24/18  0535 08/24/18 0728 08/24/18 1028  BP:  140/84 125/87 128/90  Pulse: 88 75 80 82  Resp: 18 18 17 18   Temp:  98 F (36.7 C) 98.4 F (36.9 C) 98.6 F (37 C)  TempSrc:  Oral Oral Oral  SpO2: 96% 94% 94% 97%  Weight:  114.4 kg    Height:        Intake/Output Summary (Last 24 hours) at 08/24/2018 1108 Last data filed at 08/24/2018 0851 Gross per 24 hour  Intake 1184.96 ml  Output 600 ml  Net 584.96 ml   Filed Weights   08/23/18 0400 08/23/18 1636 08/24/18 0535  Weight: 114.8 kg 114.8 kg 114.4 kg    Examination: Physical Exam:  Constitutional: WN/WD morbidly obese female and in NAD and appears calm  Eyes: Lids and conjunctivae normal, sclerae anicteric  ENMT: External Ears, Nose appear normal. Grossly normal hearing. Mucous membranes are moist.  Neck: Appears normal, supple, no cervical masses, normal ROM, no appreciable thyromegaly; no JVD Respiratory: Diminished to auscultation bilaterally, no wheezing, rales, rhonchi or crackles. Normal respiratory effort and patient is not tachypenic. No accessory muscle use.  Unlabored breathing and not wearing supplemental oxygen via nasal cannula Cardiovascular: RRR, no murmurs / rubs / gallops. S1 and S2 auscultated. 1+ LE extremity edema more so on Right  Abdomen: Soft, non-tender, distended secondary to body habitus. No masses palpated. No appreciable hepatosplenomegaly. Bowel sounds positive x4.  GU: Deferred. Musculoskeletal: No clubbing / cyanosis of digits/nails. No joint deformity upper and lower extremities.  Skin: No rashes, lesions, ulcers on a limited skin evaluation. No induration; Warm and dry.  Neurologic: CN 2-12 grossly intact with no focal deficits. Romberg sign cerebellar reflexes not assessed.  Psychiatric: Normal judgment and insight. Alert and oriented x 3. Normal mood and appropriate affect.   Data Reviewed: I have personally reviewed following labs and imaging studies  CBC: Recent  Labs  Lab 08/22/18 1115  08/23/18 0631 08/24/18 0511  WBC 8.7 8.4 7.0  HGB 14.0 11.8* 11.3*  HCT 44.5 37.9 36.1  MCV 91.0 91.1 90.3  PLT 250 229 709   Basic Metabolic Panel: Recent Labs  Lab 08/22/18 1115 08/23/18 0631 08/24/18 0511  NA 137 139 137  K 4.1 3.8 4.3  CL 101 105 106  CO2 21* 26 22  GLUCOSE 162* 118* 138*  BUN 20 16 19   CREATININE 0.88 0.87 0.88  CALCIUM 10.6* 9.2 9.1  MG  --   --  1.9  PHOS  --   --  3.2   GFR: Estimated Creatinine Clearance: 90.2 mL/min (by C-G formula based on SCr of 0.88 mg/dL). Liver Function Tests: No results for input(s): AST, ALT, ALKPHOS, BILITOT, PROT, ALBUMIN in the last 168 hours. No results for input(s): LIPASE, AMYLASE in the last 168 hours. No results for input(s): AMMONIA in the last 168 hours. Coagulation Profile: No results for input(s): INR, PROTIME in the last 168 hours. Cardiac Enzymes: No results for input(s): CKTOTAL, CKMB, CKMBINDEX, TROPONINI in the last 168 hours. BNP (last 3 results) No results for input(s): PROBNP in the last 8760 hours. HbA1C: No results for input(s): HGBA1C in the last 72 hours. CBG: Recent Labs  Lab 08/23/18 0718 08/23/18 1140 08/23/18 1508 08/23/18 2111 08/24/18 0628  GLUCAP 100* 158* 111* 142* 131*   Lipid Profile: No results for input(s): CHOL, HDL, LDLCALC, TRIG, CHOLHDL, LDLDIRECT in the last 72 hours. Thyroid Function Tests: No results for input(s): TSH, T4TOTAL, FREET4, T3FREE, THYROIDAB in the last 72 hours. Anemia Panel: No results for input(s): VITAMINB12, FOLATE, FERRITIN, TIBC, IRON, RETICCTPCT in the last 72 hours. Sepsis Labs: No results for input(s): PROCALCITON, LATICACIDVEN in the last 168 hours.  Recent Results (from the past 240 hour(s))  SARS Coronavirus 2 [facility] order, Performed in [facility] hospital lab) Nasopharyngeal Nasopharyngeal Swab     Status: None   Collection Time: 08/22/18  3:14 PM   Specimen: Nasopharyngeal Swab  Result Value Ref Range Status   SARS Coronavirus 2  NEGATIVE NEGATIVE Final    Comment: (NOTE) If result is NEGATIVE SARS-CoV-2 target nucleic acids are NOT DETECTED. The SARS-CoV-2 RNA is generally detectable in upper and lower  respiratory specimens during the acute phase of infection. The lowest  concentration of SARS-CoV-2 viral copies this assay can detect is 250  copies / mL. A negative result does not preclude SARS-CoV-2 infection  and should not be used as the sole basis for treatment or other  patient management decisions.  A negative result may occur with  improper specimen collection / handling, submission of specimen other  than nasopharyngeal swab, presence of viral mutation(s) within the  areas targeted by this assay, and inadequate number of viral copies  (<250 copies / mL). A negative result must be combined with clinical  observations, patient history, and epidemiological information. If result is POSITIVE SARS-CoV-2 target nucleic acids are DETECTED. The SARS-CoV-2 RNA is generally detectable in upper and lower  respiratory specimens dur ing the acute phase of infection.  Positive  results are indicative of active infection with SARS-CoV-2.  Clinical  correlation with patient history and other diagnostic information is  necessary to determine patient infection status.  Positive results do  not rule out bacterial infection or co-infection with other viruses. If result is PRESUMPTIVE POSTIVE SARS-CoV-2 nucleic acids MAY BE PRESENT.   A presumptive positive result was obtained on the submitted specimen  and  confirmed on repeat testing.  While 2019 novel coronavirus  (SARS-CoV-2) nucleic acids may be present in the submitted sample  additional confirmatory testing may be necessary for epidemiological  and / or clinical management purposes  to differentiate between  SARS-CoV-2 and other Sarbecovirus currently known to infect humans.  If clinically indicated additional testing with an alternate test  methodology [phone])  is advised. The SARS-CoV-2 RNA is generally  detectable in upper and lower respiratory sp ecimens during the acute  phase of infection. The expected result is Negative. Fact Sheet for Patients:  StrictlyIdeas.no Fact Sheet for Healthcare Providers: BankingDealers.co.za This test is not yet approved or cleared by the [place] FDA and has been authorized for detection and/or diagnosis of SARS-CoV-2 by FDA under an Emergency Use Authorization (EUA).  This EUA will remain in effect (meaning this test can be used) for the duration of the COVID-19 declaration under Section 564(b)(1) of the Act, 21 U.S.C. section 360bbb-3(b)(1), unless the authorization is terminated or revoked sooner. Performed at [facility], [place] [address] [place], [place] [address]   MRSA PCR Screening     Status: None   Collection Time: 08/22/18 10:49 PM   Specimen: Nasal Mucosa; Nasopharyngeal  Result Value Ref Range Status   MRSA by PCR NEGATIVE NEGATIVE Final    Comment:        The GeneXpert MRSA Assay (FDA approved for NASAL specimens only), is one component of a comprehensive MRSA colonization surveillance program. It is not intended to diagnose MRSA infection nor to guide or monitor treatment for MRSA infections. Performed at [facility], [place] [address], [place], [place] [address]      RN Pressure Injury Documentation:     Estimated body mass index is 41.97 kg/m as calculated from the following:   Height as of this encounter: 5\' 5"  (1.651 m).   Weight as of this encounter: 114.4 kg.  Malnutrition Type:      Malnutrition Characteristics:      Nutrition Interventions:           Radiology Studies: Ct Angio Chest Pe W Or Wo Contrast  Result Date: 08/22/2018 CLINICAL DATA:  Chest pain. EXAM: CT ANGIOGRAPHY CHEST WITH CONTRAST TECHNIQUE: Multidetector CT imaging of the chest was performed using the standard protocol  during bolus administration of intravenous contrast. Multiplanar CT image reconstructions and MIPs were obtained to evaluate the vascular anatomy. CONTRAST:  69mL OMNIPAQUE IOHEXOL 350 MG/ML SOLN COMPARISON:  10/11/2012 FINDINGS: Cardiovascular: Contrast injection is sufficient to demonstrate satisfactory opacification of the pulmonary arteries to the segmental level.There are extensive acute bilateral pulmonary emboli involving the main right and left pulmonary arteries and extending into the lobar, segmental, and subsegmental branches bilaterally. The greatest burden is on the right. There is CT evidence of right heart strain with an RV LV ratio measuring approximately 1.5. the heart size is mildly enlarged. There is no significant pericardial effusion. Mediastinum/Nodes: --No mediastinal or hilar lymphadenopathy. --No axillary lymphadenopathy. --No supraclavicular lymphadenopathy. --Normal thyroid gland. --the esophagus is fluid-filled to the level of the upper thorax. Lungs/Pleura: No pulmonary nodules or masses. No pleural effusion or pneumothorax. No focal airspace consolidation. No focal pleural abnormality. Upper Abdomen: The patient is status post prior sleeve gastrectomy. There is a moderate-sized hiatal hernia. Again noted is a cystic appearing structure in the spleen measuring approximately 4.5 cm. There is no acute abnormality detected in the upper abdomen. Musculoskeletal: No chest wall abnormality. No acute or significant osseous findings. Review  of the MIP images confirms the above findings. IMPRESSION: 1. Positive for acute PE with CT evidence of right heart strain (RV/LV Ratio = 1.5) consistent with at least submassive (intermediate risk) PE. The presence of right heart strain has been associated with an increased risk of morbidity and mortality. Please activate Code PE by paging [phone]. 2. The lungs are essentially clear. 3. Fluid-filled esophagus the level of the upper thorax. This places  the patient at risk for aspiration. 4. Status post sleeve gastrectomy. There is a moderate-sized hiatal hernia. These results were called by telephone at the time of interpretation on 08/22/2018 at 6:54 pm to Dr. Margarita Mail , who verbally acknowledged these results. Electronically Signed   By: Constance Holster M.D.   On: 08/22/2018 19:09   Vas [place] Lower Extremity Venous (dvt) ([place] 7a-7p)  Result Date: 08/23/2018  Lower Venous Study Indications: Pain, and Swelling.  Limitations: Body habitus and patient pain, breath holding, and tensing. Comparison Study: Prior LLE duplex done 05/20/13. Performing Technologist: Sharion Dove RVS  Examination Guidelines: A complete evaluation includes B-mode imaging, spectral Doppler, color Doppler, and power Doppler as needed of all accessible portions of each vessel. Bilateral testing is considered an integral part of a complete examination. Limited examinations for reoccurring indications may be performed as noted.  +---------+---------------+---------+-----------+----------+---------------+  RIGHT     Compressibility Phasicity Spontaneity Properties Summary          +---------+---------------+---------+-----------+----------+---------------+  CFV       Full            Yes       Yes                                     +---------+---------------+---------+-----------+----------+---------------+  FV Prox                                                    patent by color  +---------+---------------+---------+-----------+----------+---------------+  FV Mid                                                     Not visualized   +---------+---------------+---------+-----------+----------+---------------+  FV Distal Full                                                              +---------+---------------+---------+-----------+----------+---------------+  POP       Full                                                               +---------+---------------+---------+-----------+----------+---------------+  Gastroc   None  Acute            +---------+---------------+---------+-----------+----------+---------------+  SSV       None                                             Acute            +---------+---------------+---------+-----------+----------+---------------+   +---------+---------------+---------+-----------+----------+---------------+  LEFT      Compressibility Phasicity Spontaneity Properties Summary          +---------+---------------+---------+-----------+----------+---------------+  CFV       Full            Yes       Yes                                     +---------+---------------+---------+-----------+----------+---------------+  FV Prox                                                    patent by color  +---------+---------------+---------+-----------+----------+---------------+  FV Mid                                                     patent by color  +---------+---------------+---------+-----------+----------+---------------+  FV Distal                                                  Not visualized   +---------+---------------+---------+-----------+----------+---------------+  PFV                                                        Not visualized   +---------+---------------+---------+-----------+----------+---------------+  POP       Full            Yes       Yes                                     +---------+---------------+---------+-----------+----------+---------------+  PTV       Full                                                              +---------+---------------+---------+-----------+----------+---------------+  PERO                                                       Not  visualized   +---------+---------------+---------+-----------+----------+---------------+     Summary: Right: Findings consistent with acute deep vein thrombosis involving the right  gastrocnemius veins. Findings consistent with acute superficial vein thrombosis involving the right small saphenous vein. Left: There is no evidence of deep vein thrombosis in the lower extremity. However, portions of this examination were limited- see technologist comments above.  *See table(s) above for measurements and observations. Electronically signed by Servando Snare MD on 08/23/2018 at 8:07:11 AM.    Final    Scheduled Meds:  aspirin EC  81 mg Oral Daily   Chlorhexidine Gluconate Cloth  6 each Topical Daily   escitalopram  20 mg Oral Daily   heparin  1,500 Units Intravenous Once   insulin aspart  0-20 Units Subcutaneous TID WC   insulin aspart  0-5 Units Subcutaneous QHS   pantoprazole  40 mg Oral Daily   sodium chloride flush  3 mL Intravenous Once   Continuous Infusions:  heparin 1,700 Units/hr (08/24/18 1007)     LOS: 2 days   Kerney Elbe, DO [facility] PAGER is on [place]  If 7PM-7AM, please contact night-coverage [email or web address] Password [email or web address] 08/24/2018, 11:08 AM

## 2018-08-24 NOTE — Evaluation (Signed)
Physical Therapy Evaluation and Discharge Patient Details Name: [name] MRN: [id number] DOB: 09-29-1960 Today's Date: 08/24/2018   History of Present Illness  58 y.o. female who  has a past medical history of asthma, ?precancerous uterine lesion s/p hysterectomy, mesenteric arterial thrombosis previously on warfarin, prothrombin [id number] heterozygote, s/p gastric sleeve, exploratory laparotomy, hernia repair and abdominal hysterectomy presented on 08/22/18 due to presyncope, pleuritic CP, and dyspnea. CTA Chest with bilateral R>L acute PE with dilated RV. Dopplers acute deep vein thrombosis involving the right gastrocnemius veins. acute superficial vein thrombosis involving the right small saphenous vein; LLE negative  Clinical Impression   Patient evaluated by Physical Therapy with no further acute PT needs identified. All education has been completed and the patient has no further questions.  See below for any follow-up Physical Therapy or equipment needs. PT is signing off. Thank you for this referral.     Follow Up Recommendations No PT follow up    Equipment Recommendations  None recommended by PT    Recommendations for Other Services       Precautions / Restrictions Precautions Precautions: None      Mobility  Bed Mobility Overal bed mobility: Independent                Transfers Overall transfer level: Independent                  Ambulation/Gait Ambulation/Gait assistance: Independent Gait Distance (Feet): 30 Feet Assistive device: None;IV Pole Gait Pattern/deviations: Step-through pattern;Decreased stride length     General Gait Details: no incr RLE pain; slight incr SOB (1/4 not observed but felt by pt)  Stairs            Wheelchair Mobility    Modified Rankin (Stroke Patients Only)       Balance Overall balance assessment: Independent                                           Pertinent Vitals/Pain Pain  Assessment: 0-10 Pain Score: 3  Pain Location: center chest Pain Descriptors / Indicators: Pressure Pain Intervention(s): Limited activity within patient's tolerance;Monitored during session    Home Living Family/patient expects to be discharged to:: Private residence Living Arrangements: Spouse/significant other Available Help at Discharge: Family;Available PRN/intermittently Type of Home: House Home Access: Stairs to enter Entrance Stairs-Rails: Psychiatric nurse of Steps: 10 Home Layout: One level Home Equipment: None Additional Comments: lives in [place], [place]    Prior Function Level of Independence: Independent         Comments: working from home; Insurance underwriter for [employer]     Journalist, newspaper        Extremity/Trunk Assessment   Upper Extremity Assessment Upper Extremity Assessment: Overall WFL for tasks assessed    Lower Extremity Assessment Lower Extremity Assessment: RLE deficits/detail RLE Deficits / Details: slight incr edema RLE compared to LLE (noted when removed sock with marking at end of elastic)    Cervical / Trunk Assessment Cervical / Trunk Assessment: Other exceptions Cervical / Trunk Exceptions: obese  Communication   Communication: No difficulties  Cognition Arousal/Alertness: Awake/alert Behavior During Therapy: WFL for tasks assessed/performed Overall Cognitive Status: Within Functional Limits for tasks assessed  General Comments General comments (skin integrity, edema, etc.): Monitored HR 84-87 and Sats 95-96% on room air.     Exercises Other Exercises Other Exercises: Educated in ankle pumps (sitting, supine).  Other Exercises: Educated on positioning to reduce swelling RLE and to avoid static sitting with feet on the floor or standing   Assessment/Plan    PT Assessment Patent does not need any further PT services  PT Problem List         PT  Treatment Interventions      PT Goals (Current goals can be found in the Care Plan section)  Acute Rehab PT Goals PT Goal Formulation: All assessment and education complete, DC therapy    Frequency     Barriers to discharge        Co-evaluation               AM-PAC PT "6 Clicks" Mobility  Outcome Measure Help needed turning from your back to your side while in a flat bed without using bedrails?: None Help needed moving from lying on your back to sitting on the side of a flat bed without using bedrails?: None Help needed moving to and from a bed to a chair (including a wheelchair)?: None Help needed standing up from a chair using your arms (e.g., wheelchair or bedside chair)?: None Help needed to walk in hospital room?: None Help needed climbing 3-5 steps with a railing? : None 6 Click Score: 24    End of Session   Activity Tolerance: Patient tolerated treatment well Patient left: in chair;with call bell/phone within reach   PT Visit Diagnosis: Pain Pain - Right/Left: (center chest) Pain - part of body: (chest)    Time: 0539-7673 PT Time Calculation (min) (ACUTE ONLY): 19 min   Charges:   PT Evaluation $PT Eval Low Complexity: 1 Low            Barry Brunner, PT      [name] 08/24/2018, 10:53 AM

## 2018-08-25 ENCOUNTER — Inpatient Hospital Stay (HOSPITAL_COMMUNITY): Payer: 59

## 2018-08-25 DIAGNOSIS — E118 Type 2 diabetes mellitus with unspecified complications: Secondary | ICD-10-CM

## 2018-08-25 DIAGNOSIS — I824Z1 Acute embolism and thrombosis of unspecified deep veins of right distal lower extremity: Secondary | ICD-10-CM

## 2018-08-25 DIAGNOSIS — I50811 Acute right heart failure: Secondary | ICD-10-CM

## 2018-08-25 LAB — CBC WITH DIFFERENTIAL/PLATELET
Abs Immature Granulocytes: 0.07 10*3/uL (ref 0.00–0.07)
Basophils Absolute: 0 10*3/uL (ref 0.0–0.1)
Basophils Relative: 0 %
Eosinophils Absolute: 0 10*3/uL (ref 0.0–0.5)
Eosinophils Relative: 0 %
HCT: 38.5 % (ref 36.0–46.0)
Hemoglobin: 12.3 g/dL (ref 12.0–15.0)
Immature Granulocytes: 1 %
Lymphocytes Relative: 17 %
Lymphs Abs: 1.5 10*3/uL (ref 0.7–4.0)
MCH: 28.5 pg (ref 26.0–34.0)
MCHC: 31.9 g/dL (ref 30.0–36.0)
MCV: 89.1 fL (ref 80.0–100.0)
Monocytes Absolute: 0.3 10*3/uL (ref 0.1–1.0)
Monocytes Relative: 3 %
Neutro Abs: 7.3 10*3/uL (ref 1.7–7.7)
Neutrophils Relative %: 79 %
Platelets: 229 10*3/uL (ref 150–400)
RBC: 4.32 MIL/uL (ref 3.87–5.11)
RDW: 14.6 % (ref 11.5–15.5)
WBC: 9.2 10*3/uL (ref 4.0–10.5)
nRBC: 0 % (ref 0.0–0.2)

## 2018-08-25 LAB — IRON AND TIBC
Iron: 36 ug/dL (ref 28–170)
Saturation Ratios: 10 % — ABNORMAL LOW (ref 10.4–31.8)
TIBC: 343 ug/dL (ref 250–450)
UIBC: 307 ug/dL

## 2018-08-25 LAB — COMPREHENSIVE METABOLIC PANEL
ALT: 14 U/L (ref 0–44)
AST: 16 U/L (ref 15–41)
Albumin: 3.4 g/dL — ABNORMAL LOW (ref 3.5–5.0)
Alkaline Phosphatase: 69 U/L (ref 38–126)
Anion gap: 10 (ref 5–15)
BUN: 18 mg/dL (ref 6–20)
CO2: 23 mmol/L (ref 22–32)
Calcium: 9.5 mg/dL (ref 8.9–10.3)
Chloride: 105 mmol/L (ref 98–111)
Creatinine, Ser: 0.85 mg/dL (ref 0.44–1.00)
GFR calc Af Amer: >60 mL/min (ref >60)
GFR calc non Af Amer: >60 mL/min (ref >60)
Glucose, Bld: 199 mg/dL — ABNORMAL HIGH (ref 70–99)
Potassium: 4.6 mmol/L (ref 3.5–5.1)
Sodium: 138 mmol/L (ref 135–145)
Total Bilirubin: 0.5 mg/dL (ref 0.3–1.2)
Total Protein: 7 g/dL (ref 6.5–8.1)

## 2018-08-25 LAB — HEPARIN LEVEL (UNFRACTIONATED): Heparin Unfractionated: 0.42 IU/mL (ref 0.30–0.70)

## 2018-08-25 LAB — MAGNESIUM: Magnesium: 2.1 mg/dL (ref 1.7–2.4)

## 2018-08-25 LAB — RETICULOCYTES
Immature Retic Fract: 27.3 % — ABNORMAL HIGH (ref 2.3–15.9)
RBC.: 4.32 MIL/uL (ref 3.87–5.11)
Retic Count, Absolute: 81.2 10*3/uL (ref 19.0–186.0)
Retic Ct Pct: 1.9 % (ref 0.4–3.1)

## 2018-08-25 LAB — FERRITIN: Ferritin: 41 ng/mL (ref 11–307)

## 2018-08-25 LAB — GLUCOSE, CAPILLARY
Glucose-Capillary: 176 mg/dL — ABNORMAL HIGH (ref 70–99)
Glucose-Capillary: 162 mg/dL — ABNORMAL HIGH (ref 70–99)

## 2018-08-25 LAB — PHOSPHORUS: Phosphorus: 3 mg/dL (ref 2.5–4.6)

## 2018-08-25 LAB — FOLATE: Folate: 8.7 ng/mL (ref >5.9)

## 2018-08-25 LAB — VITAMIN B12: Vitamin B-12: 295 pg/mL (ref 180–914)

## 2018-08-25 MED ORDER — IOHEXOL 300 MG/ML  SOLN
100.0000 mL | Freq: Once | INTRAMUSCULAR | Status: AC | PRN
  Administered 2018-08-25: 09:00:00 100 mL via INTRAVENOUS

## 2018-08-25 MED ORDER — ASPIRIN 81 MG PO TBEC: 81.0000 mg | DELAYED_RELEASE_TABLET | Freq: Every day | ORAL | 0 refills | Status: AC

## 2018-08-25 MED ORDER — ALBUTEROL SULFATE (2.5 MG/3ML) 0.083% IN NEBU: 3.0000 mL | INHALATION_SOLUTION | Freq: Four times a day (QID) | RESPIRATORY_TRACT | 12 refills | Status: AC | PRN

## 2018-08-25 MED ORDER — ELIQUIS 5 MG VTE STARTER PACK: ORAL_TABLET | ORAL | 0 refills | Status: AC

## 2018-08-25 MED ORDER — PANTOPRAZOLE SODIUM 40 MG PO TBEC: 40.0000 mg | DELAYED_RELEASE_TABLET | Freq: Every day | ORAL | 0 refills | Status: AC

## 2018-08-25 MED ORDER — ELIQUIS 5 MG VTE STARTER PACK: ORAL_TABLET | ORAL | 0 refills | Status: DC

## 2018-08-25 MED ORDER — APIXABAN 5 MG PO TABS
10.0000 mg | ORAL_TABLET | Freq: Once | ORAL | Status: AC
  Administered 2018-08-25: 10 mg via ORAL
  Filled 2018-08-25: qty 2

## 2018-08-25 MED FILL — ASPIRIN LOW DOSE 81 MG TBEC: 81 | 30 days supply | Qty: 30 | Fill #0

## 2018-08-25 MED FILL — PANTOPRAZOLE SOD DR 40 MG T: 40 | 30 days supply | Qty: 30 | Fill #0

## 2018-08-25 MED FILL — VENTOLIN HFA 90 MCG INHALER: 108 (90 BAS | 25 days supply | Qty: 18 | Fill #0

## 2018-08-25 MED FILL — ELIQUIS STARTER PACK 5 MG T: 5 | 30 days supply | Qty: 74 | Fill #0

## 2018-08-25 NOTE — Discharge Summary (Signed)
Physician Discharge Summary  [name]:[id number] DOB: September 01, 1960 DOA: 08/22/2018  PCP: Kristopher Glee., MD  Admit date: 08/22/2018 Discharge date: 08/25/2018  Recommendations for Outpatient Follow-up:  Follow up with PCP in 7-10 days. Follow up with hematology/oncology as directed.  Discharge Diagnoses: Principal diagnosis is #1 1. Acute bilateral pulmonary embolus with RV strain 2. Right lower extremity DVT 3. + for prothrombin G20210A (heterozygote) and positve lupus anticoagulant. 4. History of endometrial cancer. 5. OSA / CPAP at night. 6. Hypercalcemia: Resolved. 7. DM II: Metofrmin held while inpatient 8. HTN: Will restart ARB as before admission.  Discharge Condition: Fair Disposition: Home  Diet recommendation: Hear healthy and carbohydrate modified.  Filed Weights   08/23/18 1636 08/24/18 0535 08/25/18 0613  Weight: 114.8 kg 114.4 kg 113 kg    History of present illness: ( As per Dr. Nelda Marseille 08/23/2018) [name] a 58 y.o.femalewhohas a past medical history of asthma, ?precancerous uterine lesion s/p hysterectomy, mesenteric arterial thrombosis previously on warfarin, prothrombin G20210A heterozygote,  She is s/pgastric sleeve, exploratory laparotomy,hernia repair and abdominal hysterectomy. She was in her USOH until 2d prior to admission developed presyncope (this has spontaneously resolved), pleuritic CP and dyspnea on exertion. She went to an urgent care 8/15 for this but left after she'd been waiting for at least 4 hours. Came to [facility] since she was visiting family in the area.   Says she previously took warfarin for 2 years when she was discovered to have mesenteric thrombosis around the time that she had her gastric sleeve done in 2016. She had no issues with ICH or GI bleeding. She denies having had endometrial cancer but wonders if she may have had precancerous lesion. Brother with a blood clot in his 49s. She does have OSA and uses PAP Cbut  unsure of her settings.  Hospital Course:  The patient is a 58 year old morbidly obese female with a past medical history significant for asthma, history of precancerous uterine lesion and endometrial cancer status post hysterectomy, mesenteric arterial thrombosis previously anticoagulated on warfarin as well as a history of prothrombin gene 20210A heterozygote, as well as a history of a sleeve gastrectomy, diabetes mellitus type 2, as well as hernia and other comorbidities who presented to the hospital with a 2-day history of presyncope that resolved, pleuritic chest pain and dyspnea on exertion.  She went to a urgent care on 08/21/2018 but left after she had been waiting for 4 hours and came to the hospital for further evaluation recommendation.  She was worked up and found to have a PE with evidence of right heart strain consistent with early submassive PE and lower extremity Dopplers that showed acute DVT in her right gastrocnemius veins as well as acute superficial vein thrombosis involving the right saphenous vein on the right and no evidence of DVT in the left.  She is started on a heparin drip and critical care was admitted and she is transition from critical care service to [facility].  She had a 2D echocardiogram on 08/23/2019 showed an EF of 55 to 60% and with right ventricular systolic function moderately reduced and left diastolic Doppler parameters are consistent with impaired relaxation.  Her symptoms are improving and Hematology/Oncology was consulted for further evaluation recommendations given her cancer history and hypercoagulable state.They have recommended discharging the patient on Apixaban.   Today's assessment: S: The patient is awake, alert, and oriented x 3. No acute distress. O: Vitals:  Vitals:   08/24/18 1953 08/25/18 0613  BP:  131/68 (!) 146/91  Pulse: 84 88  Resp: 18 18  Temp: 97.9 F (36.6 C) 97.6 F (36.4 C)  SpO2: 96% 94%    Constitutional:   The patient is resting  comfortably. No new complaints. Respiratory:   No increased work of breathing.  No wheezes, rales, or rhonchi.  No tactile fremitus. Cardiovascular:   Regular rate and rhythm.  No murmurs, ectopy, or gallups.  No lateral PMI. No thrills. Abdomen:   Abdomen is soft, non-tender, non-distended.  No hernias, masses, or organomegaly.  Normoactive bowel sounds. Musculoskeletal:   No cyanosis, clubbing, or edema Skin:   No rashes, lesions, ulcers  palpation of skin: no induration or nodules Neurologic:   CN 2-12 intact  Sensation all 4 extremities intact Psychiatric:   judgement and insight appear normal  Mental status o Mood, affect appropriate o Orientation to person, place, time   Discharge Instructions  Discharge Instructions    Activity as tolerated - No restrictions   Complete by: As directed    Call MD for:  difficulty breathing, headache or visual disturbances   Complete by: As directed    Call MD for:  extreme fatigue   Complete by: As directed    Call MD for:  persistant dizziness or light-headedness   Complete by: As directed    Diet - low sodium heart healthy   Complete by: As directed    Diet Carb Modified   Complete by: As directed    Discharge instructions   Complete by: As directed    Follow up with hematology/oncology as directed. Follow up with PCP in 7-10 days.   Increase activity slowly   Complete by: As directed      Allergies as of 08/25/2018      Reactions   Shellfish Allergy Anaphylaxis, Swelling   Contrast Media [iodinated Diagnostic Agents]    Demerol [meperidine] Hives   Phenergan [promethazine Hcl] Hives   Penicillins Rash      Medication List    STOP taking these medications   olmesartan 20 MG tablet Commonly known as: BENICAR   omeprazole 20 MG capsule Commonly known as: PRILOSEC Replaced by: pantoprazole 40 MG tablet   Ventolin HFA 108 (90 Base) MCG/ACT inhaler Generic drug: albuterol Replaced by: albuterol  (2.5 MG/3ML) 0.083% nebulizer solution     TAKE these medications   albuterol (2.5 MG/3ML) 0.083% nebulizer solution Commonly known as: PROVENTIL Inhale 3 mLs into the lungs every 6 (six) hours as needed for wheezing or shortness of breath. Replaces: Ventolin HFA 108 (90 Base) MCG/ACT inhaler   aspirin 81 MG EC tablet Take 1 tablet (81 mg total) by mouth daily. Start taking on: August 26, 2018 What changed:   how much to take  when to take this   cholecalciferol 1000 units tablet Commonly known as: VITAMIN D Take 1,000 Units by mouth daily.   Eliquis DVT/PE Starter Pack 5 MG Tabs Take as directed on package: start with two-5mg  tablets twice daily for 7 days. On day 8, switch to one-5mg  tablet twice daily.   EpiPen 0.3 mg/0.3 mL Soaj injection Generic drug: EPINEPHrine Inject 0.3 mg into the muscle daily as needed (allergic reaction).   escitalopram 20 MG tablet Commonly known as: LEXAPRO Take 20 mg by mouth daily.   metFORMIN 500 MG tablet Commonly known as: GLUCOPHAGE Take 500 mg by mouth daily with breakfast.   ondansetron 4 MG disintegrating tablet Commonly known as: ZOFRAN-ODT Take 4 mg by mouth every 6 (six) hours as  needed for nausea or vomiting.   pantoprazole 40 MG tablet Commonly known as: PROTONIX Take 1 tablet (40 mg total) by mouth daily. Start taking on: August 26, 2018 Replaces: omeprazole 20 MG capsule      Allergies  Allergen Reactions   Shellfish Allergy Anaphylaxis and Swelling   Contrast Media [Iodinated Diagnostic Agents]    Demerol [Meperidine] Hives   Phenergan [Promethazine Hcl] Hives   Penicillins Rash    The results of significant diagnostics from this hospitalization (including imaging, microbiology, ancillary and laboratory) are listed below for reference.    Significant Diagnostic Studies: Dg Chest 2 View  Result Date: 08/22/2018 CLINICAL DATA:  Chest pain since Friday. Increased heart rate and shortness of breath with  walking. EXAM: CHEST - 2 VIEW COMPARISON:  Chest x-rays dated 10/01/2017 and 10/11/2012. FINDINGS: Heart size and mediastinal contours are grossly stable. Lungs are clear. No pleural effusion or pneumothorax seen. Osseous structures about the chest are unremarkable. IMPRESSION: No active cardiopulmonary disease. No evidence of pneumonia or pulmonary edema. Electronically Signed   By: Franki Cabot M.D.   On: 08/22/2018 11:01   Ct Angio Chest Pe W Or Wo Contrast  Result Date: 08/22/2018 CLINICAL DATA:  Chest pain. EXAM: CT ANGIOGRAPHY CHEST WITH CONTRAST TECHNIQUE: Multidetector CT imaging of the chest was performed using the standard protocol during bolus administration of intravenous contrast. Multiplanar CT image reconstructions and MIPs were obtained to evaluate the vascular anatomy. CONTRAST:  61mL OMNIPAQUE IOHEXOL 350 MG/ML SOLN COMPARISON:  10/11/2012 FINDINGS: Cardiovascular: Contrast injection is sufficient to demonstrate satisfactory opacification of the pulmonary arteries to the segmental level.There are extensive acute bilateral pulmonary emboli involving the main right and left pulmonary arteries and extending into the lobar, segmental, and subsegmental branches bilaterally. The greatest burden is on the right. There is CT evidence of right heart strain with an RV LV ratio measuring approximately 1.5. the heart size is mildly enlarged. There is no significant pericardial effusion. Mediastinum/Nodes: --No mediastinal or hilar lymphadenopathy. --No axillary lymphadenopathy. --No supraclavicular lymphadenopathy. --Normal thyroid gland. --the esophagus is fluid-filled to the level of the upper thorax. Lungs/Pleura: No pulmonary nodules or masses. No pleural effusion or pneumothorax. No focal airspace consolidation. No focal pleural abnormality. Upper Abdomen: The patient is status post prior sleeve gastrectomy. There is a moderate-sized hiatal hernia. Again noted is a cystic appearing structure in the  spleen measuring approximately 4.5 cm. There is no acute abnormality detected in the upper abdomen. Musculoskeletal: No chest wall abnormality. No acute or significant osseous findings. Review of the MIP images confirms the above findings. IMPRESSION: 1. Positive for acute PE with CT evidence of right heart strain (RV/LV Ratio = 1.5) consistent with at least submassive (intermediate risk) PE. The presence of right heart strain has been associated with an increased risk of morbidity and mortality. Please activate Code PE by paging [phone]. 2. The lungs are essentially clear. 3. Fluid-filled esophagus the level of the upper thorax. This places the patient at risk for aspiration. 4. Status post sleeve gastrectomy. There is a moderate-sized hiatal hernia. These results were called by telephone at the time of interpretation on 08/22/2018 at 6:54 pm to Dr. Margarita Mail , who verbally acknowledged these results. Electronically Signed   By: Constance Holster M.D.   On: 08/22/2018 19:09   Ct Abdomen Pelvis W Contrast  Result Date: 08/25/2018 CLINICAL DATA:  Follow-up endometrial carcinoma. EXAM: CT ABDOMEN AND PELVIS WITH CONTRAST TECHNIQUE: Multidetector CT imaging of the abdomen and pelvis was performed  using the standard protocol following bolus administration of intravenous contrast. The patient received a standard 13 hour steroid prep prior to the exam. CONTRAST:  180mL OMNIPAQUE IOHEXOL 300 MG/ML  SOLN COMPARISON:  Noncontrast CT on 01/16/2014 FINDINGS: Lower Chest: No acute findings. Hepatobiliary: No hepatic masses identified. Gallstones are seen, however there is no evidence of cholecystitis or biliary dilatation. Pancreas:  No mass or inflammatory changes. Spleen: Within normal limits in size. Stable benign-appearing splenic cyst. Adrenals/Urinary Tract: No masses identified. No evidence of hydronephrosis. Unremarkable unopacified urinary bladder. Stomach/Bowel: Previous sleeve gastrectomy noted. Small  to moderate hiatal hernia is again seen. No evidence of obstruction, inflammatory process or abnormal fluid collections. Normal appendix visualized. Diverticulosis is seen mainly involving the sigmoid colon, however there is no evidence of diverticulitis. A small epigastric ventral hernia is seen containing only fat. A large midline ventral hernias seen inferior to this which contains transverse colon. A moderate hernia is seen just inferior to this in the paraumbilical region which contains multiple small bowel loops, and is located along the superior margin of surgical mesh. All these ventral hernias are new since prior study. No evidence of bowel obstruction or strangulation. Vascular/Lymphatic: No pathologically enlarged lymph nodes. No abdominal aortic aneurysm. Aortic atherosclerosis. Reproductive: Prior hysterectomy noted. Adnexal regions are unremarkable in appearance. Other:  None. Musculoskeletal:  No suspicious bone lesions identified. IMPRESSION: 1. No evidence of recurrent or metastatic carcinoma within the abdomen or pelvis. 2. Multiple new ventral hernias of varying size and contents, as described above. 3. Stable small to moderate hiatal hernia, and previous sleeve gastrectomy. 4. Cholelithiasis. No radiographic evidence of cholecystitis. 5. Colonic diverticulosis. No radiographic evidence of diverticulitis. Aortic Atherosclerosis (ICD10-I70.0). Electronically Signed   By: Marlaine Hind M.D.   On: 08/25/2018 09:53   Vas [place] Lower Extremity Venous (dvt) ([facility] [place] 7a-7p)  Result Date: 08/23/2018  Lower Venous Study Indications: Pain, and Swelling.  Limitations: Body habitus and patient pain, breath holding, and tensing. Comparison Study: Prior LLE duplex done 05/20/13. Performing Technologist: Sharion Dove RVS  Examination Guidelines: A complete evaluation includes B-mode imaging, spectral Doppler, color Doppler, and power Doppler as needed of all accessible portions of each vessel. Bilateral  testing is considered an integral part of a complete examination. Limited examinations for reoccurring indications may be performed as noted.  +---------+---------------+---------+-----------+----------+---------------+  RIGHT     Compressibility Phasicity Spontaneity Properties Summary          +---------+---------------+---------+-----------+----------+---------------+  CFV       Full            Yes       Yes                                     +---------+---------------+---------+-----------+----------+---------------+  FV Prox                                                    patent by color  +---------+---------------+---------+-----------+----------+---------------+  FV Mid  Not visualized   +---------+---------------+---------+-----------+----------+---------------+  FV Distal Full                                                              +---------+---------------+---------+-----------+----------+---------------+  POP       Full                                                              +---------+---------------+---------+-----------+----------+---------------+  Gastroc   None                                             Acute            +---------+---------------+---------+-----------+----------+---------------+  SSV       None                                             Acute            +---------+---------------+---------+-----------+----------+---------------+   +---------+---------------+---------+-----------+----------+---------------+  LEFT      Compressibility Phasicity Spontaneity Properties Summary          +---------+---------------+---------+-----------+----------+---------------+  CFV       Full            Yes       Yes                                     +---------+---------------+---------+-----------+----------+---------------+  FV Prox                                                    patent by color   +---------+---------------+---------+-----------+----------+---------------+  FV Mid                                                     patent by color  +---------+---------------+---------+-----------+----------+---------------+  FV Distal                                                  Not visualized   +---------+---------------+---------+-----------+----------+---------------+  PFV                                                        Not visualized   +---------+---------------+---------+-----------+----------+---------------+  POP       Full            Yes       Yes                                     +---------+---------------+---------+-----------+----------+---------------+  PTV       Full                                                              +---------+---------------+---------+-----------+----------+---------------+  PERO                                                       Not visualized   +---------+---------------+---------+-----------+----------+---------------+     Summary: Right: Findings consistent with acute deep vein thrombosis involving the right gastrocnemius veins. Findings consistent with acute superficial vein thrombosis involving the right small saphenous vein. Left: There is no evidence of deep vein thrombosis in the lower extremity. However, portions of this examination were limited- see technologist comments above.  *See table(s) above for measurements and observations. Electronically signed by Servando Snare MD on 08/23/2018 at 8:07:11 AM.    Final     Microbiology: Recent Results (from the past 240 hour(s))  SARS Coronavirus 2 [facility] order, Performed in [facility] hospital lab) Nasopharyngeal Nasopharyngeal Swab     Status: None   Collection Time: 08/22/18  3:14 PM   Specimen: Nasopharyngeal Swab  Result Value Ref Range Status   SARS Coronavirus 2 NEGATIVE NEGATIVE Final    Comment: (NOTE) If result is NEGATIVE SARS-CoV-2 target nucleic acids are NOT  DETECTED. The SARS-CoV-2 RNA is generally detectable in upper and lower  respiratory specimens during the acute phase of infection. The lowest  concentration of SARS-CoV-2 viral copies this assay can detect is 250  copies / mL. A negative result does not preclude SARS-CoV-2 infection  and should not be used as the sole basis for treatment or other  patient management decisions.  A negative result may occur with  improper specimen collection / handling, submission of specimen other  than nasopharyngeal swab, presence of viral mutation(s) within the  areas targeted by this assay, and inadequate number of viral copies  (<250 copies / mL). A negative result must be combined with clinical  observations, patient history, and epidemiological information. If result is POSITIVE SARS-CoV-2 target nucleic acids are DETECTED. The SARS-CoV-2 RNA is generally detectable in upper and lower  respiratory specimens dur ing the acute phase of infection.  Positive  results are indicative of active infection with SARS-CoV-2.  Clinical  correlation with patient history and other diagnostic information is  necessary to determine patient infection status.  Positive results do  not rule out bacterial infection or co-infection with other viruses. If result is PRESUMPTIVE POSTIVE SARS-CoV-2 nucleic acids MAY BE PRESENT.   A presumptive positive result was obtained on the submitted specimen  and confirmed on repeat testing.  While 2019 novel coronavirus  (SARS-CoV-2) nucleic acids may be present in the submitted sample  additional confirmatory testing may be necessary  for epidemiological  and / or clinical management purposes  to differentiate between  SARS-CoV-2 and other Sarbecovirus currently known to infect humans.  If clinically indicated additional testing with an alternate test  methodology [phone]) is advised. The SARS-CoV-2 RNA is generally  detectable in upper and lower respiratory sp ecimens during  the acute  phase of infection. The expected result is Negative. Fact Sheet for Patients:  StrictlyIdeas.no Fact Sheet for Healthcare Providers: BankingDealers.co.za This test is not yet approved or cleared by the [place] FDA and has been authorized for detection and/or diagnosis of SARS-CoV-2 by FDA under an Emergency Use Authorization (EUA).  This EUA will remain in effect (meaning this test can be used) for the duration of the COVID-19 declaration under Section 564(b)(1) of the Act, 21 U.S.C. section 360bbb-3(b)(1), unless the authorization is terminated or revoked sooner. Performed at [facility], [place] [address], [place], [place] [address]   MRSA PCR Screening     Status: None   Collection Time: 08/22/18 10:49 PM   Specimen: Nasal Mucosa; Nasopharyngeal  Result Value Ref Range Status   MRSA by PCR NEGATIVE NEGATIVE Final    Comment:        The GeneXpert MRSA Assay (FDA approved for NASAL specimens only), is one component of a comprehensive MRSA colonization surveillance program. It is not intended to diagnose MRSA infection nor to guide or monitor treatment for MRSA infections. Performed at [facility], [place] [address], [place], [place] [address]      Labs: Basic Metabolic Panel: Recent Labs  Lab 08/22/18 1115 08/23/18 0631 08/24/18 0511 08/25/18 0456  NA 137 139 137 138  K 4.1 3.8 4.3 4.6  CL 101 105 106 105  CO2 21* 26 22 23   GLUCOSE 162* 118* 138* 199*  BUN 20 16 19 18   CREATININE 0.88 0.87 0.88 0.85  CALCIUM 10.6* 9.2 9.1 9.5  MG  --   --  1.9 2.1  PHOS  --   --  3.2 3.0   Liver Function Tests: Recent Labs  Lab 08/25/18 0456  AST 16  ALT 14  ALKPHOS 69  BILITOT 0.5  PROT 7.0  ALBUMIN 3.4*   No results for input(s): LIPASE, AMYLASE in the last 168 hours. No results for input(s): AMMONIA in the last 168 hours. CBC: Recent Labs  Lab 08/22/18 1115 08/23/18 0631 08/24/18 0511  08/25/18 0456  WBC 8.7 8.4 7.0 9.2  NEUTROABS  --   --   --  7.3  HGB 14.0 11.8* 11.3* 12.3  HCT 44.5 37.9 36.1 38.5  MCV 91.0 91.1 90.3 89.1  PLT 250 229 187 229   Cardiac Enzymes: No results for input(s): CKTOTAL, CKMB, CKMBINDEX, TROPONINI in the last 168 hours. BNP: BNP (last 3 results) Recent Labs    08/22/18 1115  BNP 313.8*    ProBNP (last 3 results) No results for input(s): PROBNP in the last 8760 hours.  CBG: Recent Labs  Lab 08/24/18 1138 08/24/18 1634 08/24/18 2119 08/25/18 0639 08/25/18 1142  GLUCAP 94 94 132* 176* 162*    Active Problems:   PE (pulmonary thromboembolism) (HCC) OSA Hypercalcemia DM II HTN Major Depressive Disorder Asthma Anemia GERD History Mesenteric Arterial Thrombus History of endometrial cancer Morbid Obesity  Time coordinating discharge: 38 minutes.  Signed:        Soliana Kitko, DO [facility]  08/25/2018, 2:40 PM

## 2018-08-25 NOTE — Care Management (Signed)
Patient provided with Eliquis starter pack from TOC, and $10 capay card prior to DC through [facility].

## 2018-08-25 NOTE — Progress Notes (Signed)
[name]   DOB:1960-07-15   CB#:[id number]    ASSESSMENT & PLAN:  Bilateral pulmonary emboli, right leg DVT, history of mesenteric arterial thrombosis Outside records reviewed through Care Everywhere Initial hypercoagulability work-up was positive for lupus anticoagulant, however, she was found to be negative for lupus anticoagulation in 2017 upon repeat of lab work Heterozygous for the prothrombin gene mutation ([id number]>A) and negative for factor V Leiden mutation  The patient was previously on warfarin for 2 years after development of a mesenteric thrombosis following gastric bypass surgery followed by Aspirin 81 mg daily with no missed doses Has a first-degree relative with a venous thrombosis The patient has now developed bilateral pulmonary emboli and a right leg DVT CT is negative for recurrent malignant Recommend stop IV heparin and change to DOAC (pharmacy informed) The patient will need lifelong anticoagulation  History of endometrial cancer Status post hysterectomy in the early 2000's Does not report any symptoms concerning for recurrence However, given significant PE, I recommend CT imaging with contrast She had contrast allergy.  We will premedicate her She has a family history of breast cancer in her mother and personal history of endometrial cancer.  I will try to assess and get records related to her pathology from uterine cancer.  She would benefit from genetic counseling in the outpatient Ct is negative  Diabetes On sliding scale insulin Management per hospitalist Due to cardiovascular risk factors, ok to continue on aspirin 81 mg daily She is advised to monitor signs of bleeding  Discharge planning Home later today from my stand point I will set up follow-up in 1 month Will sign off  All questions were answered. The patient knows to call the clinic with any problems, questions or concerns.   Heath Lark, MD 08/25/2018 12:53 PM  Subjective:  She is doing  well. Minimal chest discomfort The patient denies any recent signs or symptoms of bleeding such as spontaneous epistaxis, hematuria or hematochezia.   Objective:  Vitals:   08/24/18 1953 08/25/18 0613  BP: 131/68 (!) 146/91  Pulse: 84 88  Resp: 18 18  Temp: 97.9 F (36.6 C) 97.6 F (36.4 C)  SpO2: 96% 94%     Intake/Output Summary (Last 24 hours) at 08/25/2018 1253 Last data filed at 08/25/2018 0636 Gross per 24 hour  Intake 1184.97 ml  Output 1800 ml  Net -615.03 ml    GENERAL:alert, no distress and comfortable  NEURO: alert & oriented x 3 with fluent speech, no focal motor/sensory deficits   Labs:  Recent Labs    10/01/17 0750  08/23/18 0631 08/24/18 0511 08/25/18 0456  NA 138   < > 139 137 138  K 3.9   < > 3.8 4.3 4.6  CL 104   < > 105 106 105  CO2 25   < > 26 22 23   GLUCOSE 144*   < > 118* 138* 199*  BUN 13   < > 16 19 18   CREATININE 0.63   < > 0.87 0.88 0.85  CALCIUM 9.2   < > 9.2 9.1 9.5  GFRNONAA >60   < > >60 >60 >60  GFRAA >60   < > >60 >60 >60  PROT 7.0  --   --   --  7.0  ALBUMIN 3.5  --   --   --  3.4*  AST 16  --   --   --  16  ALT 16  --   --   --  14  ALKPHOS 73  --   --   --  69  BILITOT 0.6  --   --   --  0.5   < > = values in this interval not displayed.    Studies:  Dg Chest 2 View  Result Date: 08/22/2018 CLINICAL DATA:  Chest pain since Friday. Increased heart rate and shortness of breath with walking. EXAM: CHEST - 2 VIEW COMPARISON:  Chest x-rays dated 10/01/2017 and 10/11/2012. FINDINGS: Heart size and mediastinal contours are grossly stable. Lungs are clear. No pleural effusion or pneumothorax seen. Osseous structures about the chest are unremarkable. IMPRESSION: No active cardiopulmonary disease. No evidence of pneumonia or pulmonary edema. Electronically Signed   By: Franki Cabot M.D.   On: 08/22/2018 11:01   Ct Angio Chest Pe W Or Wo Contrast  Result Date: 08/22/2018 CLINICAL DATA:  Chest pain. EXAM: CT ANGIOGRAPHY CHEST WITH  CONTRAST TECHNIQUE: Multidetector CT imaging of the chest was performed using the standard protocol during bolus administration of intravenous contrast. Multiplanar CT image reconstructions and MIPs were obtained to evaluate the vascular anatomy. CONTRAST:  8mL OMNIPAQUE IOHEXOL 350 MG/ML SOLN COMPARISON:  10/11/2012 FINDINGS: Cardiovascular: Contrast injection is sufficient to demonstrate satisfactory opacification of the pulmonary arteries to the segmental level.There are extensive acute bilateral pulmonary emboli involving the main right and left pulmonary arteries and extending into the lobar, segmental, and subsegmental branches bilaterally. The greatest burden is on the right. There is CT evidence of right heart strain with an RV LV ratio measuring approximately 1.5. the heart size is mildly enlarged. There is no significant pericardial effusion. Mediastinum/Nodes: --No mediastinal or hilar lymphadenopathy. --No axillary lymphadenopathy. --No supraclavicular lymphadenopathy. --Normal thyroid gland. --the esophagus is fluid-filled to the level of the upper thorax. Lungs/Pleura: No pulmonary nodules or masses. No pleural effusion or pneumothorax. No focal airspace consolidation. No focal pleural abnormality. Upper Abdomen: The patient is status post prior sleeve gastrectomy. There is a moderate-sized hiatal hernia. Again noted is a cystic appearing structure in the spleen measuring approximately 4.5 cm. There is no acute abnormality detected in the upper abdomen. Musculoskeletal: No chest wall abnormality. No acute or significant osseous findings. Review of the MIP images confirms the above findings. IMPRESSION: 1. Positive for acute PE with CT evidence of right heart strain (RV/LV Ratio = 1.5) consistent with at least submassive (intermediate risk) PE. The presence of right heart strain has been associated with an increased risk of morbidity and mortality. Please activate Code PE by paging [phone]. 2. The  lungs are essentially clear. 3. Fluid-filled esophagus the level of the upper thorax. This places the patient at risk for aspiration. 4. Status post sleeve gastrectomy. There is a moderate-sized hiatal hernia. These results were called by telephone at the time of interpretation on 08/22/2018 at 6:54 pm to Dr. Margarita Mail , who verbally acknowledged these results. Electronically Signed   By: Constance Holster M.D.   On: 08/22/2018 19:09   Ct Abdomen Pelvis W Contrast  Result Date: 08/25/2018 CLINICAL DATA:  Follow-up endometrial carcinoma. EXAM: CT ABDOMEN AND PELVIS WITH CONTRAST TECHNIQUE: Multidetector CT imaging of the abdomen and pelvis was performed using the standard protocol following bolus administration of intravenous contrast. The patient received a standard 13 hour steroid prep prior to the exam. CONTRAST:  167mL OMNIPAQUE IOHEXOL 300 MG/ML  SOLN COMPARISON:  Noncontrast CT on 01/16/2014 FINDINGS: Lower Chest: No acute findings. Hepatobiliary: No hepatic masses identified. Gallstones are seen, however there is no evidence of cholecystitis or biliary  dilatation. Pancreas:  No mass or inflammatory changes. Spleen: Within normal limits in size. Stable benign-appearing splenic cyst. Adrenals/Urinary Tract: No masses identified. No evidence of hydronephrosis. Unremarkable unopacified urinary bladder. Stomach/Bowel: Previous sleeve gastrectomy noted. Small to moderate hiatal hernia is again seen. No evidence of obstruction, inflammatory process or abnormal fluid collections. Normal appendix visualized. Diverticulosis is seen mainly involving the sigmoid colon, however there is no evidence of diverticulitis. A small epigastric ventral hernia is seen containing only fat. A large midline ventral hernias seen inferior to this which contains transverse colon. A moderate hernia is seen just inferior to this in the paraumbilical region which contains multiple small bowel loops, and is located along the superior  margin of surgical mesh. All these ventral hernias are new since prior study. No evidence of bowel obstruction or strangulation. Vascular/Lymphatic: No pathologically enlarged lymph nodes. No abdominal aortic aneurysm. Aortic atherosclerosis. Reproductive: Prior hysterectomy noted. Adnexal regions are unremarkable in appearance. Other:  None. Musculoskeletal:  No suspicious bone lesions identified. IMPRESSION: 1. No evidence of recurrent or metastatic carcinoma within the abdomen or pelvis. 2. Multiple new ventral hernias of varying size and contents, as described above. 3. Stable small to moderate hiatal hernia, and previous sleeve gastrectomy. 4. Cholelithiasis. No radiographic evidence of cholecystitis. 5. Colonic diverticulosis. No radiographic evidence of diverticulitis. Aortic Atherosclerosis (ICD10-I70.0). Electronically Signed   By: Marlaine Hind M.D.   On: 08/25/2018 09:53   Vas [place] Lower Extremity Venous (dvt) ([place] 7a-7p)  Result Date: 08/23/2018  Lower Venous Study Indications: Pain, and Swelling.  Limitations: Body habitus and patient pain, breath holding, and tensing. Comparison Study: Prior LLE duplex done 05/20/13. Performing Technologist: Sharion Dove RVS  Examination Guidelines: A complete evaluation includes B-mode imaging, spectral Doppler, color Doppler, and power Doppler as needed of all accessible portions of each vessel. Bilateral testing is considered an integral part of a complete examination. Limited examinations for reoccurring indications may be performed as noted.  +---------+---------------+---------+-----------+----------+---------------+ RIGHT    CompressibilityPhasicitySpontaneityPropertiesSummary         +---------+---------------+---------+-----------+----------+---------------+ CFV      Full           Yes      Yes                                  +---------+---------------+---------+-----------+----------+---------------+ FV Prox                                                patent by color +---------+---------------+---------+-----------+----------+---------------+ FV Mid                                                Not visualized  +---------+---------------+---------+-----------+----------+---------------+ FV DistalFull                                                         +---------+---------------+---------+-----------+----------+---------------+ POP      Full                                                         +---------+---------------+---------+-----------+----------+---------------+  Gastroc  None                                         Acute           +---------+---------------+---------+-----------+----------+---------------+ SSV      None                                         Acute           +---------+---------------+---------+-----------+----------+---------------+   +---------+---------------+---------+-----------+----------+---------------+ LEFT     CompressibilityPhasicitySpontaneityPropertiesSummary         +---------+---------------+---------+-----------+----------+---------------+ CFV      Full           Yes      Yes                                  +---------+---------------+---------+-----------+----------+---------------+ FV Prox                                               patent by color +---------+---------------+---------+-----------+----------+---------------+ FV Mid                                                patent by color +---------+---------------+---------+-----------+----------+---------------+ FV Distal                                             Not visualized  +---------+---------------+---------+-----------+----------+---------------+ PFV                                                   Not visualized  +---------+---------------+---------+-----------+----------+---------------+ POP      Full           Yes      Yes                                   +---------+---------------+---------+-----------+----------+---------------+ PTV      Full                                                         +---------+---------------+---------+-----------+----------+---------------+ PERO                                                  Not visualized  +---------+---------------+---------+-----------+----------+---------------+     Summary: Right: Findings consistent with acute deep vein thrombosis involving the right gastrocnemius veins. Findings consistent with acute superficial  vein thrombosis involving the right small saphenous vein. Left: There is no evidence of deep vein thrombosis in the lower extremity. However, portions of this examination were limited- see technologist comments above.  *See table(s) above for measurements and observations. Electronically signed by Servando Snare MD on 08/23/2018 at 8:07:11 AM.    Final

## 2018-08-25 NOTE — Plan of Care (Signed)

## 2018-08-25 NOTE — Plan of Care (Signed)
  Problem: Education: Goal: Knowledge of General Education information will improve Description Including pain rating scale, medication(s)/side effects and non-pharmacologic comfort measures Outcome: Progressing   Problem: Health Behavior/Discharge Planning: Goal: Ability to manage health-related needs will improve Outcome: Progressing   

## 2018-08-25 NOTE — Progress Notes (Addendum)
ANTICOAGULATION CONSULT NOTE - Follow Up Consult  Pharmacy Consult for heparin Indication: pulmonary embolus  Allergies  Allergen Reactions  . Shellfish Allergy Anaphylaxis and Swelling  . Contrast Media [Iodinated Diagnostic Agents]   . Demerol [Meperidine] Hives  . Phenergan [Promethazine Hcl] Hives  . Penicillins Rash    Patient Measurements: Height: 5\' 5"  (165.1 cm) Weight: 249 lb 3.2 oz (113 kg)(scale a) IBW/kg (Calculated) : 57 Heparin Dosing Weight: 84 kg  Vital Signs: Temp: 97.6 F (36.4 C) (08/19 0613) Temp Source: Oral (08/19 0613) BP: 146/91 (08/19 0613) Pulse Rate: 88 (08/19 0613)  Labs: Recent Labs    08/22/18 1115 08/22/18 1423  08/23/18 0631 08/24/18 0511 08/24/18 1640 08/25/18 0456  HGB 14.0  --   --  11.8* 11.3*  --  12.3  HCT 44.5  --   --  37.9 36.1  --  38.5  PLT 250  --   --  229 187  --  229  HEPARINUNFRC  --   --    < > 0.33 0.23* 0.41 0.42  CREATININE 0.88  --   --  0.87 0.88  --  0.85  TROPONINIHS 28* 29*  --   --   --   --   --    < > = values in this interval not displayed.    Estimated Creatinine Clearance: 92.6 mL/min (by C-G formula based on SCr of 0.85 mg/dL).   Medical History: Past Medical History:  Diagnosis Date  . Asthma   . Cancer [facility]) endometrial cancer per patient  . Diabetes mellitus without complication ([place])   . Hypertension   . Partial bowel obstruction ([place]) 01/17/2014  . Pulmonary embolism ([place]) 08/24/2018    Medications:  Scheduled:  . aspirin EC  81 mg Oral Daily  . Chlorhexidine Gluconate Cloth  6 each Topical Daily  . escitalopram  20 mg Oral Daily  . insulin aspart  0-20 Units Subcutaneous TID WC  . insulin aspart  0-5 Units Subcutaneous QHS  . pantoprazole  40 mg Oral Daily  . sodium chloride flush  3 mL Intravenous Once   Infusions:  . heparin 1,700 Units/hr (08/25/18 0542)    Assessment: 58 yo F on IV heparin for acute PE and DVT. Not on anticoagulation PTA.  Heparin level is therapeutic  at 0.42 on heparin 1700 units/hr. Hgb stable 12.3, pltc wnl. No overt bleeding noted.  Goal of Therapy:  Heparin level 0.3-0.7 units/ml Monitor platelets by anticoagulation protocol: Yes   Plan:  Continue IV heparin at 1700 units/hr Daily heparin level, CBC Monitor for bleeding Follow up oral anticoagulation plans  Thank You  Emeline General, PharmD Candidate 08/25/2018       10:06 AM  Please check [email or web address] for unit-specific pharmacist phone numbers  Addendum:  Asked by Dr. Alvy Bimler to initiate [place] therapy.  Will start Apixaban.  Plan: D/c Heparin drip Start apixaban 10 mg po bid x 7 days then 5 mg po bid   Alanda Slim, PharmD, [facility] Clinical Pharmacist Please see AMION for all Pharmacists' Contact Phone Numbers 08/25/2018, 1:59 PM

## 2018-08-25 NOTE — Care Management (Signed)
Submitted benefit check for DOACS.

## 2018-08-26 ENCOUNTER — Telehealth: Payer: Self-pay | Admitting: Hematology and Oncology

## 2018-08-26 NOTE — Telephone Encounter (Signed)
Scheduled appt per 8/19 sch message - pt is aware of appt date and time

## 2018-09-24 ENCOUNTER — Inpatient Hospital Stay: Payer: 59 | Attending: Hematology and Oncology | Admitting: Hematology and Oncology

## 2018-09-24 ENCOUNTER — Other Ambulatory Visit: Payer: Self-pay | Admitting: Hematology and Oncology

## 2018-09-24 ENCOUNTER — Encounter: Payer: Self-pay | Admitting: Hematology and Oncology

## 2018-09-24 ENCOUNTER — Inpatient Hospital Stay: Payer: 59

## 2018-09-24 ENCOUNTER — Other Ambulatory Visit: Payer: Self-pay

## 2018-09-24 VITALS — BP 131/86 | HR 74 | Temp 97.8°F | Resp 19 | Ht 65.0 in | Wt 254.8 lb

## 2018-09-24 DIAGNOSIS — N179 Acute kidney failure, unspecified: Secondary | ICD-10-CM

## 2018-09-24 DIAGNOSIS — I2699 Other pulmonary embolism without acute cor pulmonale: Secondary | ICD-10-CM | POA: Insufficient documentation

## 2018-09-24 DIAGNOSIS — R0602 Shortness of breath: Secondary | ICD-10-CM | POA: Diagnosis not present

## 2018-09-24 DIAGNOSIS — R55 Syncope and collapse: Secondary | ICD-10-CM | POA: Diagnosis not present

## 2018-09-24 DIAGNOSIS — Z7901 Long term (current) use of anticoagulants: Secondary | ICD-10-CM

## 2018-09-24 DIAGNOSIS — R0781 Pleurodynia: Secondary | ICD-10-CM | POA: Insufficient documentation

## 2018-09-24 DIAGNOSIS — Z888 Allergy status to other drugs, medicaments and biological substances status: Secondary | ICD-10-CM | POA: Insufficient documentation

## 2018-09-24 DIAGNOSIS — R600 Localized edema: Secondary | ICD-10-CM | POA: Insufficient documentation

## 2018-09-24 DIAGNOSIS — I10 Essential (primary) hypertension: Secondary | ICD-10-CM | POA: Diagnosis not present

## 2018-09-24 DIAGNOSIS — G4733 Obstructive sleep apnea (adult) (pediatric): Secondary | ICD-10-CM | POA: Diagnosis not present

## 2018-09-24 DIAGNOSIS — Z90710 Acquired absence of both cervix and uterus: Secondary | ICD-10-CM | POA: Diagnosis not present

## 2018-09-24 DIAGNOSIS — Z79899 Other long term (current) drug therapy: Secondary | ICD-10-CM | POA: Diagnosis not present

## 2018-09-24 DIAGNOSIS — Z880 Allergy status to penicillin: Secondary | ICD-10-CM | POA: Insufficient documentation

## 2018-09-24 DIAGNOSIS — Z7984 Long term (current) use of oral hypoglycemic drugs: Secondary | ICD-10-CM | POA: Insufficient documentation

## 2018-09-24 DIAGNOSIS — Z9884 Bariatric surgery status: Secondary | ICD-10-CM | POA: Diagnosis not present

## 2018-09-24 DIAGNOSIS — Z7982 Long term (current) use of aspirin: Secondary | ICD-10-CM | POA: Insufficient documentation

## 2018-09-24 DIAGNOSIS — E119 Type 2 diabetes mellitus without complications: Secondary | ICD-10-CM | POA: Insufficient documentation

## 2018-09-24 DIAGNOSIS — Z885 Allergy status to narcotic agent status: Secondary | ICD-10-CM | POA: Diagnosis not present

## 2018-09-24 LAB — CBC WITH DIFFERENTIAL/PLATELET
Abs Immature Granulocytes: 0.01 10*3/uL (ref 0.00–0.07)
Basophils Absolute: 0 10*3/uL (ref 0.0–0.1)
Basophils Relative: 1 %
Eosinophils Absolute: 0.1 10*3/uL (ref 0.0–0.5)
Eosinophils Relative: 2 %
HCT: 40.5 % (ref 36.0–46.0)
Hemoglobin: 12.8 g/dL (ref 12.0–15.0)
Immature Granulocytes: 0 %
Lymphocytes Relative: 36 %
Lymphs Abs: 2.2 10*3/uL (ref 0.7–4.0)
MCH: 28.6 pg (ref 26.0–34.0)
MCHC: 31.6 g/dL (ref 30.0–36.0)
MCV: 90.4 fL (ref 80.0–100.0)
Monocytes Absolute: 0.7 10*3/uL (ref 0.1–1.0)
Monocytes Relative: 11 %
Neutro Abs: 3.1 10*3/uL (ref 1.7–7.7)
Neutrophils Relative %: 50 %
Platelets: 258 10*3/uL (ref 150–400)
RBC: 4.48 MIL/uL (ref 3.87–5.11)
RDW: 15 % (ref 11.5–15.5)
WBC: 6.1 10*3/uL (ref 4.0–10.5)
nRBC: 0 % (ref 0.0–0.2)

## 2018-09-24 LAB — BASIC METABOLIC PANEL - CANCER CENTER ONLY
Anion gap: 11 (ref 5–15)
BUN: 20 mg/dL (ref 6–20)
CO2: 26 mmol/L (ref 22–32)
Calcium: 9.8 mg/dL (ref 8.9–10.3)
Chloride: 107 mmol/L (ref 98–111)
Creatinine: 0.86 mg/dL (ref 0.44–1.00)
GFR, Est AFR Am: >60 mL/min (ref >60)
GFR, Estimated: >60 mL/min (ref >60)
Glucose, Bld: 135 mg/dL — ABNORMAL HIGH (ref 70–99)
Potassium: 4.3 mmol/L (ref 3.5–5.1)
Sodium: 144 mmol/L (ref 135–145)

## 2018-09-24 MED ORDER — APIXABAN 5 MG PO TABS: 5.0000 mg | ORAL_TABLET | Freq: Two times a day (BID) | ORAL | 11 refills | Status: AC

## 2018-09-24 MED FILL — ELIQUIS 5 MG TABLET: 5 | 30 days supply | Qty: 60 | Fill #0

## 2018-09-24 NOTE — Progress Notes (Signed)
[place] OFFICE PROGRESS NOTE  Kristopher Glee., MD  ASSESSMENT & PLAN:  PE (pulmonary thromboembolism) [facility]) Her initial hypercoagulability work-up was positive for lupus anticoagulant, however, she was found to be negative for lupus anticoagulation in 2017 upon repeat of lab workHeterozygous for the prothrombin gene mutation(20210G>A)and negative for factor V Leiden mutation  The patient was previously on warfarin for 2 years after development of a mesenteric thrombosis following gastric bypass surgeryfollowed by Aspirin 81 mg daily with no missed doses Has a first-degree relative with a venous thrombosis The patient has recently developed bilateral pulmonary emboli and a right leg DVT CT is negative for recurrent malignant She tolerated Eliquis well without side effects I refill her prescription Duration of treatment is lifelong I recommend she gets refills through her primary care doctor in the future She does not need long-term follow-up with me I will see her back in the future if she needs perioperative anticoagulation management  Bilateral lower extremity edema Could be due to fluid retention or poor circulation I recommend the use of elastic compression hose as tolerated   No orders of the defined types were placed in this encounter.   INTERVAL HISTORY: [name] 58 y.o. female returns for further follow-up She is doing well She continues to have bilateral lower extremity edema especially after she wakes up She denies chest pain or shortness of breath The patient denies any recent signs or symptoms of bleeding such as spontaneous epistaxis, hematuria or hematochezia.    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 The patient currently lives in [place], [place].  The patient reported that she developed presyncopal episodes and was seen at an urgent care 3 days prior to admission in [place], [place].  Her presyncopal episodes resolved.  She traveled [place], [place] to visit family where she developed pleuritic chest pain and shortness of breath.  She was seen at a local urgent care on the day of admission advised to come to the emergency room for further evaluation.  In the emergency room, she has CT angiogram of the chest with and without contrast which was positive for acute pulmonary emboli with evidence of right heart strain consistent with early submassive PE.  She also had a Doppler ultrasound of bilateral lower extremities which showed acute DVT involving the right gastrocnemius veins on the right, acute superficial vein thrombosis involving the right small saphenous vein on the right, no evidence of DVT in the left lower extremity.  The patient was started on IV heparin.  She had a 2D echocardiogram performed on 08/23/2018 which showed ejection fraction of 55 to 60%, left ventricular diastolic Doppler parameters are consistent with impaired relaxation, right ventricle has moderately reduced systolic function.  The patient states that she had gastric bypass surgery in 2016 and developed a mesenteric arterial thrombosis postoperatively.  She was placed on Coumadin for anticoagulation and remained on this for approximately 2 years.  During her hospitalization, her work-up was positive for lupus anticoagulant.  She had outpatient follow-up with hematology again in June 2017.  Review of the chart states that since she is heterozygous for prothrombin gene mutation, indefinite anticoagulation was not recommended.  She was told to discontinue her  warfarin but to take 81 mg of aspirin daily for prophylaxis.  She has been routinely taking her aspirin without any missed doses.  The patient also notes that she has a brother with a history of venous thrombosis in his early 30s.  The patient states that she also has a history of endometrial cancer diagnosed in the early 2000's.  She had a hysterectomy and did not require any additional treatment for her endometrial cancer.  She states that she is up-to-date with her mammogram and her colonoscopy.  Denies history of tobacco and alcohol use.  I have reviewed the past medical history, past surgical history, social history and family history with the patient and they are unchanged from previous note.  ALLERGIES:  is allergic to shellfish allergy; contrast media [iodinated diagnostic agents]; demerol [meperidine]; phenergan [promethazine hcl]; and penicillins.  MEDICATIONS:  Current Outpatient Medications  Medication Sig Dispense Refill  . albuterol (PROVENTIL) (2.5 MG/3ML) 0.083% nebulizer solution Inhale 3 mLs into the lungs every 6 (six) hours as needed for wheezing or shortness of breath. 75 mL 12  . apixaban (ELIQUIS) 5 MG TABS tablet Take 1 tablet (5 mg total) by mouth 2 (two) times daily. 180 tablet 11  . aspirin EC 81 MG EC tablet Take 1 tablet (81 mg total) by mouth daily. 30 tablet 0  . atorvastatin (LIPITOR) 40 MG tablet Take 40 mg by mouth daily.    . cholecalciferol (VITAMIN D) 1000 UNITS tablet Take 1,000 Units by mouth daily.    Marland Kitchen escitalopram (LEXAPRO) 20 MG tablet Take 20 mg by mouth daily.    . metFORMIN (GLUCOPHAGE) 500 MG tablet Take 500 mg by mouth daily with breakfast.    . olmesartan (BENICAR) 20 MG tablet Take 20 mg by mouth daily.    . pantoprazole (PROTONIX) 40 MG tablet Take 1 tablet (40 mg total) by mouth daily. 30 tablet 0  . Eliquis DVT/PE Starter Pack (ELIQUIS STARTER PACK) 5 MG TABS Take as directed on package: start with two-5mg  tablets twice daily for 7 days. On day  8, switch to one-5mg  tablet twice daily. 1 each 0  . EPINEPHrine (EPIPEN) 0.3 mg/0.3 mL SOAJ injection Inject 0.3 mg into the muscle daily as needed (allergic reaction).     . ondansetron (ZOFRAN-ODT) 4 MG disintegrating tablet Take 4 mg by mouth every 6 (six) hours as needed for nausea or vomiting.     No current facility-administered medications for this visit.      REVIEW OF SYSTEMS:   Constitutional: Denies fevers, chills or night sweats Eyes: Denies blurriness of vision Ears, nose, mouth, throat, and face: Denies mucositis or sore throat Respiratory: Denies cough, dyspnea or wheezes Cardiovascular: Denies palpitation, chest discomfort Gastrointestinal:  Denies nausea, heartburn or change in bowel habits Skin: Denies abnormal skin rashes Lymphatics: Denies new lymphadenopathy or easy bruising Neurological:Denies numbness, tingling or new weaknesses Behavioral/Psych: Mood is stable, no new changes  All other systems were reviewed with the patient and are negative.  PHYSICAL EXAMINATION: ECOG PERFORMANCE STATUS: 1 - Symptomatic but completely ambulatory  Vitals:   09/24/18 1007  BP: 131/86  Pulse: 74  Resp: 19  Temp: 97.8 F (36.6 C)  SpO2: 98%   Filed Weights   09/24/18 1007  Weight: 254 lb 12.8 oz (115.6 kg)    GENERAL:alert, no distress and comfortable HEART: minimal bilateral lower extremity edema NEURO: alert & oriented x 3 with fluent speech, no focal motor/sensory deficits  LABORATORY  DATA:  I have reviewed the data as listed     Component Value Date/Time   NA 144 09/24/2018 0940   K 4.3 09/24/2018 0940   CL 107 09/24/2018 0940   CO2 26 09/24/2018 0940   GLUCOSE 135 (H) 09/24/2018 0940   GLUCOSE 113 (H) 01/08/2006 1058   BUN 20 09/24/2018 0940   CREATININE 0.86 09/24/2018 0940   CALCIUM 9.8 09/24/2018 0940   PROT 7.0 08/25/2018 0456   ALBUMIN 3.4 (L) 08/25/2018 0456   AST 16 08/25/2018 0456   ALT 14 08/25/2018 0456   ALKPHOS 69 08/25/2018 0456    BILITOT 0.5 08/25/2018 0456   GFRNONAA >60 09/24/2018 0940   GFRAA >60 09/24/2018 0940    No results found for: SPEP, UPEP  Lab Results  Component Value Date   WBC 6.1 09/24/2018   NEUTROABS 3.1 09/24/2018   HGB 12.8 09/24/2018   HCT 40.5 09/24/2018   MCV 90.4 09/24/2018   PLT 258 09/24/2018      Chemistry      Component Value Date/Time   NA 144 09/24/2018 0940   K 4.3 09/24/2018 0940   CL 107 09/24/2018 0940   CO2 26 09/24/2018 0940   BUN 20 09/24/2018 0940   CREATININE 0.86 09/24/2018 0940      Component Value Date/Time   CALCIUM 9.8 09/24/2018 0940   ALKPHOS 69 08/25/2018 0456   AST 16 08/25/2018 0456   ALT 14 08/25/2018 0456   BILITOT 0.5 08/25/2018 0456     I spent 15 minutes counseling the patient face to face. The total time spent in the appointment was 20 minutes and more than 50% was on counseling.   All questions were answered. The patient knows to call the clinic with any problems, questions or concerns. No barriers to learning was detected.    Heath Lark, MD 9/18/20201:56 PM

## 2018-09-24 NOTE — Assessment & Plan Note (Signed)
Her initial hypercoagulability work-up was positive for lupus anticoagulant, however, she was found to be negative for lupus anticoagulation in 2017 upon repeat of lab workHeterozygous for the prothrombin gene mutation(20210G>A)and negative for factor V Leiden mutation  The patient was previously on warfarin for 2 years after development of a mesenteric thrombosis following gastric bypass surgeryfollowed by Aspirin 81 mg daily with no missed doses Has a first-degree relative with a venous thrombosis The patient has recently developed bilateral pulmonary emboli and a right leg DVT CT is negative for recurrent malignant She tolerated Eliquis well without side effects I refill her prescription Duration of treatment is lifelong I recommend she gets refills through her primary care doctor in the future She does not need long-term follow-up with me I will see her back in the future if she needs perioperative anticoagulation management

## 2018-09-24 NOTE — Assessment & Plan Note (Signed)
Could be due to fluid retention or poor circulation I recommend the use of elastic compression hose as tolerated

## 2018-10-18 MED FILL — ELIQUIS 5 MG TABLET: 5 | 30 days supply | Qty: 60 | Fill #1

## 2018-11-22 MED FILL — ELIQUIS 5 MG TABLET: 5 | 30 days supply | Qty: 60 | Fill #2

## 2019-01-06 ENCOUNTER — Telehealth: Payer: Self-pay

## 2019-01-06 NOTE — Telephone Encounter (Signed)
Called per Dr. Alvy Bimler and told her in the future to get Eliquis Rx filled thru PCP. Dr. Alvy Bimler filled it for one year. She verbalized understanding.

## 2020-02-07 NOTE — Progress Notes (Signed)
RN reviewed request for stopping Eliquis prior to colonoscopy with MD.   Per MD - Pt should be receiving this medication now from her PCP - Recommendations for PCP to review request, if patient has not received from PCP patient will need office visit.   RN faxed response noting above to [facility] at [phone].

## 2020-08-21 ENCOUNTER — Other Ambulatory Visit: Payer: Self-pay | Admitting: Surgery

## 2020-09-04 NOTE — Pre-Procedure Instructions (Signed)
Surgical Instructions    Your procedure is scheduled on Thursday, September 15th, 2022.  Report to [facility] Main Entrance "A" at 10:30 A.M., then check in with the Admitting office.  Call this number if you have problems the morning of surgery:  [phone]   If you have any questions prior to your surgery date call [phone]: Open Monday-Friday 8am-4pm    Remember:  Do not eat after midnight the night before your surgery  You may drink clear liquids until 09:30 A.M. the morning of your surgery.   Clear liquids allowed are: Water, Non-Citrus Juices (without pulp), Carbonated Beverages, Clear Tea, Black Coffee ONLY (NO MILK, CREAM OR POWDERED CREAMER of any kind), and Gatorade   Enhanced Recovery after Surgery for Orthopedics Enhanced Recovery after Surgery is a protocol used to improve the stress on your body and your recovery after surgery.  Patient Instructions  The day of surgery (if you have diabetes):  Drink ONE small 10 oz bottle of water by 09:30 am the morning of surgery This bottle was given to you during your hospital  pre-op appointment visit.  Nothing else to drink after completing the  Small 10 oz bottle of water.         If you have questions, please contact your surgeon's office.     Take these medicines the morning of surgery with A SIP OF WATER: atorvastatin (LIPITOR) escitalopram (LEXAPRO) pantoprazole (PROTONIX)   Take these medications the morning of surgery WITH A SIP OF WATER AS NEEDED: albuterol (PROVENTIL) (2.5 MG/3ML)  EPINEPHrine 0.3 mg/0.3 mL IJ SOAJ injection  Follow your surgeon's instructions on when to stop apixaban (ELIQUIS).  If no instructions were given by your surgeon then you will need to call the office to get those instructions.    As of today, STOP taking any Aspirin (unless otherwise instructed by your surgeon) Aleve, Naproxen, Ibuprofen, Motrin, Advil, Goody's, BC's, all herbal medications, fish oil, and all  vitamins.   WHAT DO I DO ABOUT MY DIABETES MEDICATION?   Do not take metFORMIN (GLUCOPHAGE) or RYBELSUS the morning of surgery.   HOW TO MANAGE YOUR DIABETES BEFORE AND AFTER SURGERY  Why is it important to control my blood sugar before and after surgery? Improving blood sugar levels before and after surgery helps healing and can limit problems. A way of improving blood sugar control is eating a healthy diet by:  Eating less sugar and carbohydrates  Increasing activity/exercise  Talking with your doctor about reaching your blood sugar goals High blood sugars (greater than 180 mg/dL) can raise your risk of infections and slow your recovery, so you will need to focus on controlling your diabetes during the weeks before surgery. Make sure that the doctor who takes care of your diabetes knows about your planned surgery including the date and location.  How do I manage my blood sugar before surgery? Check your blood sugar at least 4 times a day, starting 2 days before surgery, to make sure that the level is not too high or low.  Check your blood sugar the morning of your surgery when you wake up and every 2 hours until you get to the Short Stay unit.  If your blood sugar is less than 70 mg/dL, you will need to treat for low blood sugar: Treat a low blood sugar (less than 70 mg/dL) with  cup of clear juice (cranberry or apple), 4 glucose tablets, OR glucose gel. Recheck blood sugar in 15 minutes after treatment (to make  sure it is greater than 70 mg/dL). If your blood sugar is not greater than 70 mg/dL on recheck, call [phone] for further instructions. Report your blood sugar to the short stay nurse when you get to Short Stay.  If you are admitted to the hospital after surgery: Your blood sugar will be checked by the staff and you will probably be given insulin after surgery (instead of oral diabetes medicines) to make sure you have good blood sugar levels. The goal for blood sugar  control after surgery is 80-180 mg/dL.           Do not wear jewelry or makeup Do not wear lotions, powders, perfumes, or deodorant. Do not shave 48 hours prior to surgery.   Do not bring valuables to the hospital. DO Not wear nail polish, gel polish, artificial nails, or any other type of covering on  natural nails including finger and toenails. If patients have artificial nails, gel coating, etc. that need to be removed by a nail salon please have this removed prior to surgery or surgery may need to be canceled/delayed if the surgeon/ anesthesia feels like the patient is unable to be adequately monitored.             [facility] is not responsible for any belongings or valuables.  Do NOT Smoke (Tobacco/Vaping) or drink Alcohol 24 hours prior to your procedure If you use a CPAP at night, you may bring all equipment for your overnight stay.   Contacts, glasses, dentures or bridgework may not be worn into surgery, please bring cases for these belongings   For patients admitted to the hospital, discharge time will be determined by your treatment team.   Patients discharged the day of surgery will not be allowed to drive home, and someone needs to stay with them for 24 hours.  ONLY 1 SUPPORT PERSON MAY BE PRESENT WHILE YOU ARE IN SURGERY. IF YOU ARE TO BE ADMITTED ONCE YOU ARE IN YOUR ROOM YOU WILL BE ALLOWED TWO (2) VISITORS.  Minor children may have two parents present. Special consideration for safety and communication needs will be reviewed on a case by case basis.  Special instructions:    Oral Hygiene is also important to reduce your risk of infection.  Remember - BRUSH YOUR TEETH THE MORNING OF SURGERY WITH YOUR REGULAR TOOTHPASTE   [facility]- Preparing For Surgery  Before surgery, you can play an important role. Because skin is not sterile, your skin needs to be as free of germs as possible. You can reduce the number of germs on your skin by washing with CHG (chlorahexidine  gluconate) Soap before surgery.  CHG is an antiseptic cleaner which kills germs and bonds with the skin to continue killing germs even after washing.     Please do not use if you have an allergy to CHG or antibacterial soaps. If your skin becomes reddened/irritated stop using the CHG.  Do not shave (including legs and underarms) for at least 48 hours prior to first CHG shower. It is OK to shave your face.  Please follow these instructions carefully.     Shower the NIGHT BEFORE SURGERY and the MORNING OF SURGERY with CHG Soap.   If you chose to wash your hair, wash your hair first as usual with your normal shampoo. After you shampoo, rinse your hair and body thoroughly to remove the shampoo.  Then [employer] and genitals (private parts) with your normal soap and rinse thoroughly to remove soap.  After that Use CHG Soap as you would any other liquid soap. You can apply CHG directly to the skin and wash gently with a scrungie or a clean washcloth.   Apply the CHG Soap to your body ONLY FROM THE NECK DOWN.  Do not use on open wounds or open sores. Avoid contact with your eyes, ears, mouth and genitals (private parts). Wash Face and genitals (private parts)  with your normal soap.   Wash thoroughly, paying special attention to the area where your surgery will be performed.  Thoroughly rinse your body with warm water from the neck down.  DO NOT shower/wash with your normal soap after using and rinsing off the CHG Soap.  Pat yourself dry with a CLEAN TOWEL.  Wear CLEAN PAJAMAS to bed the night before surgery  Place CLEAN SHEETS on your bed the night before your surgery  DO NOT SLEEP WITH PETS.   Day of Surgery:  Take a shower with CHG soap. Wear Clean/Comfortable clothing the morning of surgery Do not apply any deodorants/lotions.   Remember to brush your teeth WITH YOUR REGULAR TOOTHPASTE.   Please read over the following fact sheets that you were given.

## 2020-09-05 ENCOUNTER — Other Ambulatory Visit: Payer: Self-pay

## 2020-09-05 ENCOUNTER — Encounter (HOSPITAL_COMMUNITY)
Admission: RE | Admit: 2020-09-05 | Discharge: 2020-09-05 | Disposition: A | Discharge status: Home / Self Care | Payer: 59 | Source: Ambulatory Visit | Attending: Surgery | Admitting: Surgery

## 2020-09-05 ENCOUNTER — Encounter (HOSPITAL_COMMUNITY): Payer: Self-pay

## 2020-09-05 DIAGNOSIS — Z01818 Encounter for other preprocedural examination: Secondary | ICD-10-CM | POA: Insufficient documentation

## 2020-09-05 HISTORY — DX: Gastro-esophageal reflux disease without esophagitis: K21.9

## 2020-09-05 HISTORY — DX: Sleep apnea, unspecified: G47.30

## 2020-09-05 LAB — BASIC METABOLIC PANEL
Anion gap: 8 (ref 5–15)
BUN: 11 mg/dL (ref 6–20)
CO2: 26 mmol/L (ref 22–32)
Calcium: 9.3 mg/dL (ref 8.9–10.3)
Chloride: 104 mmol/L (ref 98–111)
Creatinine, Ser: 0.75 mg/dL (ref 0.44–1.00)
GFR, Estimated: >60 mL/min (ref >60)
Glucose, Bld: 139 mg/dL — ABNORMAL HIGH (ref 70–99)
Potassium: 4.1 mmol/L (ref 3.5–5.1)
Sodium: 138 mmol/L (ref 135–145)

## 2020-09-05 LAB — CBC
HCT: 40.9 % (ref 36.0–46.0)
Hemoglobin: 12.6 g/dL (ref 12.0–15.0)
MCH: 27.5 pg (ref 26.0–34.0)
MCHC: 30.8 g/dL (ref 30.0–36.0)
MCV: 89.3 fL (ref 80.0–100.0)
Platelets: 270 10*3/uL (ref 150–400)
RBC: 4.58 MIL/uL (ref 3.87–5.11)
RDW: 16.1 % — ABNORMAL HIGH (ref 11.5–15.5)
WBC: 7.6 10*3/uL (ref 4.0–10.5)
nRBC: 0 % (ref 0.0–0.2)

## 2020-09-05 LAB — HEMOGLOBIN A1C
Hgb A1c MFr Bld: 8.7 % — ABNORMAL HIGH (ref 4.8–5.6)
Mean Plasma Glucose: 203 mg/dL

## 2020-09-05 LAB — GLUCOSE, CAPILLARY: Glucose-Capillary: 142 mg/dL — ABNORMAL HIGH (ref 70–99)

## 2020-09-05 NOTE — Progress Notes (Signed)
PCP - Yong Channel, MD Cardiologist - pt denies  PPM/ICD - n/a  Chest x-ray - n/a EKG - 09/05/20 in PAT Stress Test - pt denies ECHO - 08/23/18 Cardiac Cath - pt denies  Sleep Study - pt reports that she had a sleep study done 7-8 years ago w/[facility]; records requested  CPAP - yes  Fasting Blood Sugar - pt is diabetic, but does not have a glucometer  Blood Thinner Instructions: pt reports her surgeon told her to stop Eliquis 2 days before surgery Aspirin Instructions: As of today, STOP taking any Aspirin (unless otherwise instructed by your surgeon)  ERAS Protcol - yes PRE-SURGERY Ensure or G2- 10oz water bottle  COVID TEST- no, ambulatory surgery  Anesthesia review: no  Patient denies shortness of breath, fever, cough and chest pain at PAT appointment   All instructions explained to the patient, with a verbal understanding of the material. Patient agrees to go over the instructions while at home for a better understanding. Patient also instructed to self quarantine after being tested for COVID-19. The opportunity to ask questions was provided.

## 2020-09-19 NOTE — H&P (Signed)
REFERRING PHYSICIAN: Kristopher Glee, MD  PROVIDER: Beverlee Nims, MD  MRN: [id number] DOB: 1960/12/25 DATE OF ENCOUNTER: 08/21/2020  Subjective   Chief Complaint: Abdominal pain  History of Present Illness: [name] is a 60 y.o. female who is seen today as an office consultation at the request of Dr. Karle Starch for evaluation of abdominal pain.   This is a pleasant 60 year old female with a complex medical history. She has had history of endometrial cancer and has had a hysterectomy in the past. She then developed an incisional hernia and had a laparoscopic repair with mesh in 2007. She is on Eliquis for history of pulmonary embolism. She has been having increasing intermittent right-sided abdominal pain. She has had no nausea or vomiting. She had an ultrasound showing only one 7 mm gallstone. There were no other abnormalities. She had a CAT scan of the abdomen and pelvis as a surveillance for her history of endometrial cancer. This showed multiple hernia defects containing fat, transverse colon, and small bowel. There is no evidence of obstruction. Today she feels well. She has no other complaints.  Review of Systems: A complete review of systems was obtained from the patient. I have reviewed this information and discussed as appropriate with the patient. See HPI as well for other ROS.  ROS   Medical History: Past Medical History:  Diagnosis Date   Asthma, unspecified asthma severity, unspecified whether complicated, unspecified whether persistent   Diabetes mellitus without complication (CMS-HCC)   GERD (gastroesophageal reflux disease)   Hypertension   Sleep apnea   There is no problem list on file for this patient.  Past Surgical History:  Procedure Laterality Date   bariactric surgery N/A   HERNIA REPAIR   HYSTERECTOMY    Allergies  Allergen Reactions   Meperidine Hives   Promethazine Other (See Comments) and Hives  Unknown reaction   Shellfish Containing  Products Anaphylaxis and Swelling   Iodinated Contrast Media Rash   Penicillins Rash and Hives   Current Outpatient Medications on File Prior to Visit  Medication Sig Dispense Refill   atorvastatin (LIPITOR) 40 MG tablet Take 1 tablet by mouth once daily   EPINEPHrine (EPIPEN) 0.3 mg/0.3 mL auto-injector Inject into the muscle   escitalopram oxalate (LEXAPRO) 10 MG tablet Take 1 tablet by mouth once daily   losartan (COZAAR) 50 MG tablet Take 50 mg by mouth once daily   metFORMIN (GLUCOPHAGE) 500 MG tablet Take 2 tablets BID   pantoprazole (PROTONIX) 20 MG DR tablet Take 20 mg by mouth once daily   cholecalciferol (VITAMIN D3) 2,000 unit capsule Take 1 capsule by mouth once daily   ELIQUIS 5 mg tablet   RYBELSUS 7 mg tablet   No current facility-administered medications on file prior to visit.   Family History  Problem Relation Age of Onset   Obesity Mother   High blood pressure (Hypertension) Mother   Hyperlipidemia (Elevated cholesterol) Mother   Diabetes Mother   Breast cancer Mother   Stroke Father   Obesity Father   High blood pressure (Hypertension) Father   Hyperlipidemia (Elevated cholesterol) Father   Heart valve disease Father    Social History   Tobacco Use  Smoking Status Never Smoker  Smokeless Tobacco Never Used    Social History   Socioeconomic History   Marital status: Unknown  Tobacco Use   Smoking status: Never Smoker   Smokeless tobacco: Never Used  Media planner   Vaping Use: Never used  Substance and Sexual  Activity   Alcohol use: Never   Drug use: Never   Objective:   Vitals:  08/21/20 1150  BP: 134/82  Pulse: 85  Temp: 36.7 C (98.1 F)  SpO2: 94%  Weight: (!) 117.9 kg (260 lb)  Height: 162.6 cm ('5\' 4"'$ )   Body mass index is 44.63 kg/m.  Physical Exam   She appears well on exam.  Her abdomen is soft and morbidly obese. Her midline incision is well-healed. There are multiple fascial defects and a chronically incarcerated  incisional hernia which is nontender to the right of the midline. There is only questionable right upper quadrant tenderness.  There is no hepatomegaly.  Cardiovascular is regular rate and rhythm  Lungs are clear  Labs, Imaging and Diagnostic Testing: I reviewed her ultrasound as well as her CAT scan of the abdomen and pelvis  Assessment and Plan:  Diagnoses and all orders for this visit:  Ventral incisional hernia    I had a long discussion with the patient regarding her incisional hernias as well as her solitary gallstone. I suspect that the hernias are more of a cause of her discomfort than the solitary gallstone. I discussed incisional hernia repairs with mesh as well as cholecystectomy. We cannot do a cholecystectomy at the time of hernia repair as this is considered during the surgery and would negate the use of mesh for the hernia repair which I believe is very necessary given the size of her fascial defect. I would recommend that we proceed with a hernia repair with mesh first to see if this relieves her symptoms. Again she has significant fascial defects. We would attempt this with a laparoscopic incisional hernia repair with mesh. I discussed this with her in detail. We discussed the risk of the surgery which includes but is not limited to bleeding, infection, injury to surrounding structures, injury to the bowel, the need to convert to an open procedure, cardiopulmonary issues, etc. She would need to stop her Eliquis 2 days before surgery. She understands this. We discussed postoperative recovery. We also discussed eventual cholecystectomy should she still have any symptoms consistent with biliary colic after her hernia repair. Again, she understands and wished to proceed with the laparoscopic incisional hernia repair with mesh.

## 2020-09-20 ENCOUNTER — Encounter (HOSPITAL_COMMUNITY)
Admission: RE | Disposition: A | Discharge status: Home / Self Care | Payer: Self-pay | Source: Home / Self Care | Attending: Surgery

## 2020-09-20 ENCOUNTER — Other Ambulatory Visit: Payer: Self-pay

## 2020-09-20 ENCOUNTER — Encounter (HOSPITAL_COMMUNITY): Payer: Self-pay | Admitting: Surgery

## 2020-09-20 ENCOUNTER — Ambulatory Visit (HOSPITAL_COMMUNITY): Payer: 59 | Admitting: Certified Registered Nurse Anesthetist

## 2020-09-20 ENCOUNTER — Inpatient Hospital Stay (HOSPITAL_COMMUNITY)
Admission: RE | Admit: 2020-09-20 | Discharge: 2020-09-24 | DRG: 354 | Disposition: A | Discharge status: Home / Self Care | Payer: 59 | Attending: Surgery | Admitting: Surgery

## 2020-09-20 DIAGNOSIS — K8020 Calculus of gallbladder without cholecystitis without obstruction: Secondary | ICD-10-CM | POA: Diagnosis present

## 2020-09-20 DIAGNOSIS — Z8542 Personal history of malignant neoplasm of other parts of uterus: Secondary | ICD-10-CM | POA: Diagnosis not present

## 2020-09-20 DIAGNOSIS — Z7901 Long term (current) use of anticoagulants: Secondary | ICD-10-CM | POA: Diagnosis not present

## 2020-09-20 DIAGNOSIS — K432 Incisional hernia without obstruction or gangrene: Principal | ICD-10-CM | POA: Diagnosis present

## 2020-09-20 DIAGNOSIS — J45909 Unspecified asthma, uncomplicated: Secondary | ICD-10-CM | POA: Diagnosis present

## 2020-09-20 DIAGNOSIS — Z86711 Personal history of pulmonary embolism: Secondary | ICD-10-CM

## 2020-09-20 DIAGNOSIS — Z833 Family history of diabetes mellitus: Secondary | ICD-10-CM

## 2020-09-20 DIAGNOSIS — Z7984 Long term (current) use of oral hypoglycemic drugs: Secondary | ICD-10-CM

## 2020-09-20 DIAGNOSIS — E119 Type 2 diabetes mellitus without complications: Secondary | ICD-10-CM | POA: Diagnosis present

## 2020-09-20 DIAGNOSIS — Z6841 Body Mass Index (BMI) 40.0 and over, adult: Secondary | ICD-10-CM

## 2020-09-20 DIAGNOSIS — Z880 Allergy status to penicillin: Secondary | ICD-10-CM | POA: Diagnosis not present

## 2020-09-20 DIAGNOSIS — Z5331 Laparoscopic surgical procedure converted to open procedure: Secondary | ICD-10-CM | POA: Diagnosis not present

## 2020-09-20 DIAGNOSIS — Z885 Allergy status to narcotic agent status: Secondary | ICD-10-CM

## 2020-09-20 DIAGNOSIS — I10 Essential (primary) hypertension: Secondary | ICD-10-CM | POA: Diagnosis present

## 2020-09-20 DIAGNOSIS — Z91013 Allergy to seafood: Secondary | ICD-10-CM | POA: Diagnosis not present

## 2020-09-20 DIAGNOSIS — Z8249 Family history of ischemic heart disease and other diseases of the circulatory system: Secondary | ICD-10-CM

## 2020-09-20 DIAGNOSIS — Z91041 Radiographic dye allergy status: Secondary | ICD-10-CM | POA: Diagnosis not present

## 2020-09-20 DIAGNOSIS — Z20822 Contact with and (suspected) exposure to covid-19: Secondary | ICD-10-CM | POA: Diagnosis present

## 2020-09-20 DIAGNOSIS — G4730 Sleep apnea, unspecified: Secondary | ICD-10-CM | POA: Diagnosis present

## 2020-09-20 DIAGNOSIS — Z888 Allergy status to other drugs, medicaments and biological substances status: Secondary | ICD-10-CM

## 2020-09-20 DIAGNOSIS — K219 Gastro-esophageal reflux disease without esophagitis: Secondary | ICD-10-CM | POA: Diagnosis present

## 2020-09-20 DIAGNOSIS — Z79899 Other long term (current) drug therapy: Secondary | ICD-10-CM | POA: Diagnosis not present

## 2020-09-20 HISTORY — PX: LYSIS OF ADHESION: SHX5961

## 2020-09-20 HISTORY — PX: LAPAROSCOPY: SHX197

## 2020-09-20 HISTORY — PX: INSERTION OF MESH: SHX5868

## 2020-09-20 HISTORY — PX: INCISIONAL HERNIA REPAIR: SHX193

## 2020-09-20 LAB — GLUCOSE, CAPILLARY
Glucose-Capillary: 135 mg/dL — ABNORMAL HIGH (ref 70–99)
Glucose-Capillary: 168 mg/dL — ABNORMAL HIGH (ref 70–99)

## 2020-09-20 LAB — SARS CORONAVIRUS 2 BY RT PCR (HOSPITAL ORDER, PERFORMED IN ~~LOC~~ HOSPITAL LAB): SARS Coronavirus 2: NEGATIVE

## 2020-09-20 SURGERY — LAPAROTOMY, FOR LYSIS OF ADHESIONS
Anesthesia: General | Site: Abdomen

## 2020-09-20 MED ORDER — ALBUMIN HUMAN 5 % IV SOLN: INTRAVENOUS | Status: DC | PRN

## 2020-09-20 MED ORDER — CHLORHEXIDINE GLUCONATE 0.12 % MT SOLN
OROMUCOSAL | Status: AC
  Administered 2020-09-20: 15 mL via OROMUCOSAL
  Filled 2020-09-20: qty 15

## 2020-09-20 MED ORDER — ESMOLOL HCL 100 MG/10ML IV SOLN
INTRAVENOUS | Status: AC
  Filled 2020-09-20: qty 10

## 2020-09-20 MED ORDER — ONDANSETRON HCL 4 MG/2ML IJ SOLN
INTRAMUSCULAR | Status: DC | PRN
  Administered 2020-09-20: 4 mg via INTRAVENOUS

## 2020-09-20 MED ORDER — ORAL CARE MOUTH RINSE: 15.0000 mL | Freq: Once | OROMUCOSAL | Status: AC

## 2020-09-20 MED ORDER — POTASSIUM CHLORIDE IN NACL 20-0.9 MEQ/L-% IV SOLN
INTRAVENOUS | Status: DC
  Filled 2020-09-20 (×6): qty 1000

## 2020-09-20 MED ORDER — PROPOFOL 10 MG/ML IV BOLUS
INTRAVENOUS | Status: AC
  Filled 2020-09-20: qty 20

## 2020-09-20 MED ORDER — ENSURE PRE-SURGERY PO LIQD: 296.0000 mL | Freq: Once | ORAL | Status: DC

## 2020-09-20 MED ORDER — MIDAZOLAM HCL 5 MG/5ML IJ SOLN
INTRAMUSCULAR | Status: DC | PRN
  Administered 2020-09-20 (×2): 1 mg via INTRAVENOUS

## 2020-09-20 MED ORDER — HYDROMORPHONE HCL 1 MG/ML IJ SOLN
INTRAMUSCULAR | Status: AC
  Administered 2020-09-20: 1 mg via INTRAVENOUS
  Filled 2020-09-20: qty 1

## 2020-09-20 MED ORDER — FENTANYL CITRATE (PF) 250 MCG/5ML IJ SOLN
INTRAMUSCULAR | Status: AC
  Filled 2020-09-20: qty 5

## 2020-09-20 MED ORDER — EPHEDRINE SULFATE-NACL 50-0.9 MG/10ML-% IV SOSY
PREFILLED_SYRINGE | INTRAVENOUS | Status: DC | PRN
  Administered 2020-09-20: 5 mg via INTRAVENOUS

## 2020-09-20 MED ORDER — PANTOPRAZOLE SODIUM 40 MG IV SOLR
40.0000 mg | Freq: Every day | INTRAVENOUS | Status: DC
  Administered 2020-09-21 – 2020-09-23 (×4): 40 mg via INTRAVENOUS
  Filled 2020-09-20 (×4): qty 40

## 2020-09-20 MED ORDER — ONDANSETRON HCL 4 MG/2ML IJ SOLN: 4.0000 mg | Freq: Once | INTRAMUSCULAR | Status: DC | PRN

## 2020-09-20 MED ORDER — HYDROMORPHONE HCL 1 MG/ML IJ SOLN
INTRAMUSCULAR | Status: AC
  Filled 2020-09-20: qty 1

## 2020-09-20 MED ORDER — LOSARTAN POTASSIUM 50 MG PO TABS
50.0000 mg | ORAL_TABLET | Freq: Every day | ORAL | Status: DC
  Administered 2020-09-21 – 2020-09-24 (×4): 50 mg via ORAL
  Filled 2020-09-20 (×4): qty 1

## 2020-09-20 MED ORDER — CIPROFLOXACIN IN D5W 400 MG/200ML IV SOLN
400.0000 mg | INTRAVENOUS | Status: AC
  Administered 2020-09-20: 400 mg via INTRAVENOUS
  Filled 2020-09-20: qty 200

## 2020-09-20 MED ORDER — METHOCARBAMOL 500 MG PO TABS
500.0000 mg | ORAL_TABLET | Freq: Four times a day (QID) | ORAL | Status: DC | PRN
  Administered 2020-09-21 – 2020-09-22 (×3): 500 mg via ORAL
  Filled 2020-09-20 (×4): qty 1

## 2020-09-20 MED ORDER — LACTATED RINGERS IV SOLN: INTRAVENOUS | Status: DC

## 2020-09-20 MED ORDER — ONDANSETRON 4 MG PO TBDP: 4.0000 mg | ORAL_TABLET | Freq: Four times a day (QID) | ORAL | Status: DC | PRN

## 2020-09-20 MED ORDER — KETOROLAC TROMETHAMINE 30 MG/ML IJ SOLN
INTRAMUSCULAR | Status: AC
  Filled 2020-09-20: qty 1

## 2020-09-20 MED ORDER — BUPIVACAINE-EPINEPHRINE (PF) 0.25% -1:200000 IJ SOLN
INTRAMUSCULAR | Status: AC
  Filled 2020-09-20: qty 30

## 2020-09-20 MED ORDER — ROCURONIUM BROMIDE 10 MG/ML (PF) SYRINGE
PREFILLED_SYRINGE | INTRAVENOUS | Status: AC
  Filled 2020-09-20: qty 10

## 2020-09-20 MED ORDER — SODIUM CHLORIDE 0.9 % IR SOLN
Status: DC | PRN
  Administered 2020-09-20 (×3): 1000 mL

## 2020-09-20 MED ORDER — GABAPENTIN 300 MG PO CAPS
300.0000 mg | ORAL_CAPSULE | Freq: Two times a day (BID) | ORAL | Status: DC
  Administered 2020-09-20 – 2020-09-24 (×8): 300 mg via ORAL
  Filled 2020-09-20 (×8): qty 1

## 2020-09-20 MED ORDER — DEXAMETHASONE SODIUM PHOSPHATE 10 MG/ML IJ SOLN
INTRAMUSCULAR | Status: AC
  Filled 2020-09-20: qty 1

## 2020-09-20 MED ORDER — ONDANSETRON HCL 4 MG/2ML IJ SOLN: 4.0000 mg | Freq: Four times a day (QID) | INTRAMUSCULAR | Status: DC | PRN

## 2020-09-20 MED ORDER — HYDROMORPHONE HCL 1 MG/ML IJ SOLN
0.2500 mg | INTRAMUSCULAR | Status: DC | PRN
  Administered 2020-09-20 (×4): 0.5 mg via INTRAVENOUS

## 2020-09-20 MED ORDER — ACETAMINOPHEN 325 MG PO TABS: 325.0000 mg | ORAL_TABLET | ORAL | Status: DC | PRN

## 2020-09-20 MED ORDER — ESCITALOPRAM OXALATE 10 MG PO TABS
10.0000 mg | ORAL_TABLET | Freq: Every day | ORAL | Status: DC
  Administered 2020-09-21 – 2020-09-24 (×4): 10 mg via ORAL
  Filled 2020-09-20 (×4): qty 1

## 2020-09-20 MED ORDER — DEXMEDETOMIDINE (PRECEDEX) IN NS 20 MCG/5ML (4 MCG/ML) IV SYRINGE
PREFILLED_SYRINGE | INTRAVENOUS | Status: AC
  Filled 2020-09-20: qty 5

## 2020-09-20 MED ORDER — DIPHENHYDRAMINE HCL 50 MG/ML IJ SOLN: 25.0000 mg | Freq: Four times a day (QID) | INTRAMUSCULAR | Status: DC | PRN

## 2020-09-20 MED ORDER — SUGAMMADEX SODIUM 500 MG/5ML IV SOLN
INTRAVENOUS | Status: AC
  Filled 2020-09-20: qty 5

## 2020-09-20 MED ORDER — OXYCODONE HCL 5 MG PO TABS
5.0000 mg | ORAL_TABLET | Freq: Once | ORAL | Status: DC | PRN
Start: 2020-09-20 — End: 2020-09-20

## 2020-09-20 MED ORDER — ACETAMINOPHEN 160 MG/5ML PO SOLN: 325.0000 mg | ORAL | Status: DC | PRN

## 2020-09-20 MED ORDER — CHLORHEXIDINE GLUCONATE 0.12 % MT SOLN: 15.0000 mL | Freq: Once | OROMUCOSAL | Status: AC

## 2020-09-20 MED ORDER — ENOXAPARIN SODIUM 40 MG/0.4ML IJ SOSY
40.0000 mg | PREFILLED_SYRINGE | INTRAMUSCULAR | Status: DC
  Administered 2020-09-21 – 2020-09-24 (×4): 40 mg via SUBCUTANEOUS
  Filled 2020-09-20 (×5): qty 0.4

## 2020-09-20 MED ORDER — CHLORHEXIDINE GLUCONATE CLOTH 2 % EX PADS: 6.0000 | MEDICATED_PAD | Freq: Once | CUTANEOUS | Status: DC

## 2020-09-20 MED ORDER — FENTANYL CITRATE (PF) 250 MCG/5ML IJ SOLN
INTRAMUSCULAR | Status: DC | PRN
  Administered 2020-09-20: 50 ug via INTRAVENOUS
  Administered 2020-09-20: 25 ug via INTRAVENOUS
  Administered 2020-09-20: 50 ug via INTRAVENOUS
  Administered 2020-09-20: 25 ug via INTRAVENOUS
  Administered 2020-09-20: 50 ug via INTRAVENOUS
  Administered 2020-09-20: 100 ug via INTRAVENOUS
  Administered 2020-09-20: 50 ug via INTRAVENOUS

## 2020-09-20 MED ORDER — OXYCODONE HCL 5 MG PO TABS
5.0000 mg | ORAL_TABLET | ORAL | Status: DC | PRN
  Administered 2020-09-20 – 2020-09-23 (×8): 10 mg via ORAL
  Filled 2020-09-20 (×8): qty 2

## 2020-09-20 MED ORDER — ACETAMINOPHEN 500 MG PO TABS: 1000.0000 mg | ORAL_TABLET | ORAL | Status: AC

## 2020-09-20 MED ORDER — ACETAMINOPHEN 500 MG PO TABS
ORAL_TABLET | ORAL | Status: AC
  Administered 2020-09-20: 1000 mg via ORAL
  Filled 2020-09-20: qty 2

## 2020-09-20 MED ORDER — PROPOFOL 10 MG/ML IV BOLUS
INTRAVENOUS | Status: DC | PRN
  Administered 2020-09-20: 200 mg via INTRAVENOUS

## 2020-09-20 MED ORDER — LIDOCAINE 2% (20 MG/ML) 5 ML SYRINGE
INTRAMUSCULAR | Status: AC
  Filled 2020-09-20: qty 5

## 2020-09-20 MED ORDER — CEFAZOLIN IN SODIUM CHLORIDE 3-0.9 GM/100ML-% IV SOLN
INTRAVENOUS | Status: AC
  Filled 2020-09-20: qty 100

## 2020-09-20 MED ORDER — OXYCODONE HCL 5 MG/5ML PO SOLN: 5.0000 mg | Freq: Once | ORAL | Status: DC | PRN

## 2020-09-20 MED ORDER — HYDROMORPHONE HCL 1 MG/ML IJ SOLN
1.0000 mg | INTRAMUSCULAR | Status: DC | PRN
  Administered 2020-09-22: 1 mg via INTRAVENOUS
  Filled 2020-09-20: qty 1

## 2020-09-20 MED ORDER — DEXAMETHASONE SODIUM PHOSPHATE 10 MG/ML IJ SOLN
INTRAMUSCULAR | Status: DC | PRN
  Administered 2020-09-20: 4 mg via INTRAVENOUS

## 2020-09-20 MED ORDER — TRAMADOL HCL 50 MG PO TABS: 50.0000 mg | ORAL_TABLET | Freq: Four times a day (QID) | ORAL | Status: DC | PRN

## 2020-09-20 MED ORDER — MIDAZOLAM HCL 2 MG/2ML IJ SOLN
INTRAMUSCULAR | Status: AC
  Filled 2020-09-20: qty 2

## 2020-09-20 MED ORDER — ACETAMINOPHEN 500 MG PO TABS
1000.0000 mg | ORAL_TABLET | Freq: Four times a day (QID) | ORAL | Status: DC
  Administered 2020-09-21 – 2020-09-24 (×12): 1000 mg via ORAL
  Filled 2020-09-20 (×14): qty 2

## 2020-09-20 MED ORDER — KETOROLAC TROMETHAMINE 30 MG/ML IJ SOLN
30.0000 mg | Freq: Once | INTRAMUSCULAR | Status: AC | PRN
  Administered 2020-09-20: 30 mg via INTRAVENOUS

## 2020-09-20 MED ORDER — SUGAMMADEX SODIUM 500 MG/5ML IV SOLN
INTRAVENOUS | Status: DC | PRN
  Administered 2020-09-20 (×2): 100 mg via INTRAVENOUS
  Administered 2020-09-20: 150 mg via INTRAVENOUS
  Administered 2020-09-20: 100 mg via INTRAVENOUS

## 2020-09-20 MED ORDER — PHENYLEPHRINE HCL-NACL 20-0.9 MG/250ML-% IV SOLN
INTRAVENOUS | Status: DC | PRN
  Administered 2020-09-20: 20 ug/min via INTRAVENOUS

## 2020-09-20 MED ORDER — ROCURONIUM BROMIDE 10 MG/ML (PF) SYRINGE
PREFILLED_SYRINGE | INTRAVENOUS | Status: DC | PRN
  Administered 2020-09-20 (×2): 20 mg via INTRAVENOUS
  Administered 2020-09-20: 10 mg via INTRAVENOUS
  Administered 2020-09-20: 70 mg via INTRAVENOUS
  Administered 2020-09-20: 20 mg via INTRAVENOUS

## 2020-09-20 MED ORDER — DIPHENHYDRAMINE HCL 25 MG PO CAPS: 25.0000 mg | ORAL_CAPSULE | Freq: Four times a day (QID) | ORAL | Status: DC | PRN

## 2020-09-20 MED ORDER — DEXMEDETOMIDINE (PRECEDEX) IN NS 20 MCG/5ML (4 MCG/ML) IV SYRINGE
PREFILLED_SYRINGE | INTRAVENOUS | Status: DC | PRN
  Administered 2020-09-20 (×2): 8 ug via INTRAVENOUS

## 2020-09-20 MED ORDER — LIDOCAINE 2% (20 MG/ML) 5 ML SYRINGE
INTRAMUSCULAR | Status: DC | PRN
  Administered 2020-09-20: 100 mg via INTRAVENOUS

## 2020-09-20 MED ORDER — CEFAZOLIN SODIUM-DEXTROSE 2-4 GM/100ML-% IV SOLN
2.0000 g | INTRAVENOUS | Status: DC
  Filled 2020-09-20: qty 100

## 2020-09-20 MED ORDER — PHENYLEPHRINE 40 MCG/ML (10ML) SYRINGE FOR IV PUSH (FOR BLOOD PRESSURE SUPPORT)
PREFILLED_SYRINGE | INTRAVENOUS | Status: DC | PRN
Start: 2020-09-20 — End: 2020-09-20
  Administered 2020-09-20: 80 ug via INTRAVENOUS
  Administered 2020-09-20: 40 ug via INTRAVENOUS
  Administered 2020-09-20 (×2): 80 ug via INTRAVENOUS

## 2020-09-20 MED ORDER — ONDANSETRON HCL 4 MG/2ML IJ SOLN
INTRAMUSCULAR | Status: AC
  Filled 2020-09-20: qty 2

## 2020-09-20 SURGICAL SUPPLY — 68 items
APPLIER CLIP 5 13 M/L LIGAMAX5 (MISCELLANEOUS)
APPLIER CLIP ROT 10 11.4 M/L (STAPLE)
BAG COUNTER SPONGE SURGICOUNT (BAG) ×3 IMPLANT
BIOPATCH BLUE 3/4IN DISK W/1.5 (GAUZE/BANDAGES/DRESSINGS) ×1 IMPLANT
BIOPATCH RED 1 DISK 7.0 (GAUZE/BANDAGES/DRESSINGS) ×2 IMPLANT
BLADE CLIPPER SURG (BLADE) IMPLANT
CANISTER SUCT 3000ML PPV (MISCELLANEOUS) IMPLANT
CHLORAPREP W/TINT 26 (MISCELLANEOUS) ×3 IMPLANT
CLIP APPLIE 5 13 M/L LIGAMAX5 (MISCELLANEOUS) IMPLANT
CLIP APPLIE ROT 10 11.4 M/L (STAPLE) IMPLANT
COVER SURGICAL LIGHT HANDLE (MISCELLANEOUS) ×3 IMPLANT
DECANTER SPIKE VIAL GLASS SM (MISCELLANEOUS) ×3 IMPLANT
DERMABOND ADVANCED (GAUZE/BANDAGES/DRESSINGS) ×1
DERMABOND ADVANCED .7 DNX12 (GAUZE/BANDAGES/DRESSINGS) ×2 IMPLANT
DEVICE SECURE STRAP 25 ABSORB (INSTRUMENTS) ×3 IMPLANT
DEVICE TROCAR PUNCTURE CLOSURE (ENDOMECHANICALS) ×3 IMPLANT
DRAIN CHANNEL 19F RND (DRAIN) ×2 IMPLANT
DRAPE LAPAROSCOPIC ABDOMINAL (DRAPES) ×3 IMPLANT
DRSG OPSITE POSTOP 4X10 (GAUZE/BANDAGES/DRESSINGS) ×1 IMPLANT
DRSG TEGADERM 2-3/8X2-3/4 SM (GAUZE/BANDAGES/DRESSINGS) ×1 IMPLANT
DRSG TEGADERM 4X4.75 (GAUZE/BANDAGES/DRESSINGS) ×1 IMPLANT
ELECT CAUTERY BLADE 6.4 (BLADE) ×1 IMPLANT
ELECT PENCIL ROCKER SW 15FT (MISCELLANEOUS) ×1 IMPLANT
ELECT REM PT RETURN 9FT ADLT (ELECTROSURGICAL) ×3
ELECTRODE REM PT RTRN 9FT ADLT (ELECTROSURGICAL) ×2 IMPLANT
EVACUATOR SILICONE 100CC (DRAIN) ×2 IMPLANT
GAUZE SPONGE 2X2 8PLY NS (GAUZE/BANDAGES/DRESSINGS) ×1 IMPLANT
GAUZE SPONGE 2X2 8PLY STRL LF (GAUZE/BANDAGES/DRESSINGS) IMPLANT
GAUZE SPONGE 4X4 12PLY STRL (GAUZE/BANDAGES/DRESSINGS) ×1 IMPLANT
GLOVE SURG SIGNA 7.5 PF LTX (GLOVE) ×3 IMPLANT
GOWN STRL REUS W/ TWL LRG LVL3 (GOWN DISPOSABLE) ×4 IMPLANT
GOWN STRL REUS W/ TWL XL LVL3 (GOWN DISPOSABLE) ×2 IMPLANT
GOWN STRL REUS W/TWL LRG LVL3 (GOWN DISPOSABLE) ×6
GOWN STRL REUS W/TWL XL LVL3 (GOWN DISPOSABLE) ×3
HANDLE SUCTION POOLE (INSTRUMENTS) IMPLANT
KIT BASIN OR (CUSTOM PROCEDURE TRAY) ×3 IMPLANT
KIT TURNOVER KIT B (KITS) ×3 IMPLANT
MARKER SKIN DUAL TIP RULER LAB (MISCELLANEOUS) ×3 IMPLANT
MESH VENTRALIGHT ST 8X10 (Mesh General) ×1 IMPLANT
NDL SPNL 22GX3.5 QUINCKE BK (NEEDLE) ×2 IMPLANT
NEEDLE SPNL 22GX3.5 QUINCKE BK (NEEDLE) ×3 IMPLANT
NS IRRIG 1000ML POUR BTL (IV SOLUTION) ×3 IMPLANT
PAD ARMBOARD 7.5X6 YLW CONV (MISCELLANEOUS) ×6 IMPLANT
SCISSORS LAP 5X35 DISP (ENDOMECHANICALS) IMPLANT
SET IRRIG TUBING LAPAROSCOPIC (IRRIGATION / IRRIGATOR) IMPLANT
SET TUBE SMOKE EVAC HIGH FLOW (TUBING) ×3 IMPLANT
SHEARS HARMONIC ACE PLUS 36CM (ENDOMECHANICALS) ×1 IMPLANT
SLEEVE ENDOPATH XCEL 5M (ENDOMECHANICALS) ×5 IMPLANT
SPONGE GAUZE 2X2 STER 10/PKG (GAUZE/BANDAGES/DRESSINGS) ×1
SPONGE T-LAP 18X18 ~~LOC~~+RFID (SPONGE) ×2 IMPLANT
STAPLER VISISTAT 35W (STAPLE) ×1 IMPLANT
SUCTION POOLE HANDLE (INSTRUMENTS) ×3
SUT ETHILON 2 0 FS 18 (SUTURE) ×2 IMPLANT
SUT MON AB 4-0 PC3 18 (SUTURE) ×3 IMPLANT
SUT NOVA 1 T20/GS 25DT (SUTURE) ×6 IMPLANT
SUT NOVA NAB GS-21 0 18 T12 DT (SUTURE) ×3 IMPLANT
SUT SILK 3 0 SH CR/8 (SUTURE) ×1 IMPLANT
SUT VIC AB 3-0 SH 18 (SUTURE) ×2 IMPLANT
TOWEL GREEN STERILE (TOWEL DISPOSABLE) ×3 IMPLANT
TOWEL GREEN STERILE FF (TOWEL DISPOSABLE) ×3 IMPLANT
TRAY FOLEY W/BAG SLVR 16FR (SET/KITS/TRAYS/PACK) ×3
TRAY FOLEY W/BAG SLVR 16FR ST (SET/KITS/TRAYS/PACK) IMPLANT
TRAY LAPAROSCOPIC MC (CUSTOM PROCEDURE TRAY) ×3 IMPLANT
TROCAR XCEL NON-BLD 11X100MML (ENDOMECHANICALS) ×3 IMPLANT
TROCAR XCEL NON-BLD 5MMX100MML (ENDOMECHANICALS) ×3 IMPLANT
TUBE CONNECTING 20X1/4 (TUBING) ×1 IMPLANT
WATER STERILE IRR 1000ML POUR (IV SOLUTION) ×3 IMPLANT
YANKAUER SUCT BULB TIP NO VENT (SUCTIONS) ×1 IMPLANT

## 2020-09-20 NOTE — Progress Notes (Signed)
Pacu RN Report to floor given  Gave report to [employer]. [id number]. Discussed surgery, meds given in OR and Pacu, VS, IV fluids given, EBL, urine output, pain and other pertinent information. Also discussed if pt had any family or friends here or belongings with them. Discussed Jp drains, abd binder and that procedure changed from [place] to open.  Pt exits my care.

## 2020-09-20 NOTE — Anesthesia Postprocedure Evaluation (Signed)
Anesthesia Post Note  Patient: [name]  Procedure(s) Performed: LYSIS OF ADHESIONS (Abdomen) OPEN HERNIA REPAIR INCISIONAL (Abdomen) LAPAROSCOPY DIAGNOSTIC (Abdomen) INSERTION OF MESH (Abdomen)     Patient location during evaluation: PACU Anesthesia Type: General Level of consciousness: awake Pain management: pain level controlled Vital Signs Assessment: post-procedure vital signs reviewed and stable Respiratory status: spontaneous breathing Cardiovascular status: stable Postop Assessment: no apparent nausea or vomiting Anesthetic complications: no   No notable events documented.  Last Vitals:  Vitals:   09/20/20 1551 09/20/20 1604  BP: 120/71 124/69  Pulse: (!) 101 (!) 104  Resp: 19 17  Temp:  36.7 C  SpO2: 93% 94%    Last Pain:  Vitals:   09/20/20 1604  TempSrc:   PainSc: [place]

## 2020-09-20 NOTE — Interval H&P Note (Signed)
History and Physical Interval Note:no change in H and p  09/20/2020 11:15 AM  [name]  has presented today for surgery, with the diagnosis of INCISIONAL HERNIA.  The various methods of treatment have been discussed with the patient and family. After consideration of risks, benefits and other options for treatment, the patient has consented to  Procedure(s): [place] (N/A) as a surgical intervention.  The patient's history has been reviewed, patient examined, no change in status, stable for surgery.  I have reviewed the patient's chart and labs.  Questions were answered to the patient's satisfaction.     Coralie Keens

## 2020-09-20 NOTE — Anesthesia Preprocedure Evaluation (Signed)
Anesthesia Evaluation  Patient identified by MRN, date of birth, ID band Patient awake    Reviewed: Allergy & Precautions, NPO status , Patient's Chart, lab work & pertinent test results  Airway Mallampati: II       Dental no notable dental hx.    Pulmonary asthma , sleep apnea ,    Pulmonary exam normal        Cardiovascular hypertension, Pt. on medications Normal cardiovascular exam     Neuro/Psych negative neurological ROS     GI/Hepatic GERD  Medicated and Controlled,  Endo/Other  diabetes, Type 2, Oral Hypoglycemic AgentsMorbid obesity  Renal/GU   negative genitourinary   Musculoskeletal negative musculoskeletal ROS (+)   Abdominal   Peds  Hematology   Anesthesia Other Findings   Reproductive/Obstetrics                             Anesthesia Physical Anesthesia Plan  ASA: 3  Anesthesia Plan: General   Post-op Pain Management:    Induction: Intravenous  PONV Risk Score and Plan: 4 or greater and Ondansetron, Midazolam and Treatment may vary due to age or medical condition  Airway Management Planned: Oral ETT  Additional Equipment: None  Intra-op Plan:   Post-operative Plan: Extubation in OR  Informed Consent: I have reviewed the patients History and Physical, chart, labs and discussed the procedure including the risks, benefits and alternatives for the proposed anesthesia with the patient or authorized representative who has indicated his/her understanding and acceptance.     Dental advisory given  Plan Discussed with: CRNA  Anesthesia Plan Comments:         Anesthesia Quick Evaluation

## 2020-09-20 NOTE — Anesthesia Procedure Notes (Signed)
Procedure Name: Intubation Date/Time: 09/20/2020 12:00 PM Performed by: Janene Harvey, CRNA Pre-anesthesia Checklist: Patient identified, Emergency Drugs available, Suction available and Patient being monitored Patient Re-evaluated:Patient Re-evaluated prior to induction Oxygen Delivery Method: Circle system utilized Preoxygenation: Pre-oxygenation with 100% oxygen Induction Type: IV induction Ventilation: Two handed mask ventilation required Laryngoscope Size: Mac and 4 Grade View: Grade II Tube type: Oral Tube size: 7.0 mm Number of attempts: 1 Airway Equipment and Method: Stylet and Oral airway Placement Confirmation: ETT inserted through vocal cords under direct vision, positive ETCO2 and breath sounds checked- equal and bilateral Secured at: 22 cm Tube secured with: Tape Dental Injury: Teeth and Oropharynx as per pre-operative assessment

## 2020-09-20 NOTE — Op Note (Signed)
[name] 09/20/2020   Pre-op Diagnosis: INCISIONAL HERNIA     Post-op Diagnosis: same  Procedure(s): LAPAROSCOPIC CONVERTED TO OPEN INCISIONAL HERNIA REPAIR WITH MESH LYSIS OF ADHESIONS  Surgeon(s): Coralie Keens, MD Jovita Kussmaul, MD  A second surgeon was required as an assistant secondary to the complexity of this very large hernia, the patient's extensive intra-abdominal lesions, and her morbid obesity  Anesthesia: General  Staff:  Circulator: Agapito Games, RN; Delman Cheadle, RN Scrub Person: Chevis Pretty, RN  Estimated Blood Loss: 200 mL               Indications: This is a 60 year old female who has had multiple abdominal procedures including a hysterectomy for endometrial cancer.  She is also had a previous laparoscopic incisional hernia repair with mesh.  She now has a recurrent large incisional hernia containing both colon and small intestines in the hernia and multiple fascial defect  Findings: The patient had an extensive mount of intra-abdominal adhesions and small large bowel was fixated into the hernia sac.  The procedure had to be converted to an open procedure in order to avoid bowel injury given the extensive amount of adhesions from the small bowel into the hernia as well as with the colon.  The fascial defect itself was approximately 10 cm x 20 cm and could not be closed.  A 20 cm x 30 cm piece of ventral light ST mesh from [employer] was placed as an underlay.  Procedure: The patient was brought to the operating room identifies correct patient.  She is placed upon the operating table general anesthesia was induced.  Her abdomen was prepped and draped in usual sterile fashion.  I made a small incision the patient's left upper quadrant with a scalpel and then using the 5 mm trocar and Optiview camera I slowly traversed all layers of the abdominal wall and gained entrance into the abdominal cavity under direct vision.  Insufflation of  the abdomen was begun.  I inserted the camera and evaluated the site and saw no evidence of bowel injury.  The patient had a large fascial defect with other smaller defects.  The large hernia contained omentum, colon, and small bowel.  I placed 2 more 5 mm trochars under direct vision the patient's left mid abdomen.  I was able to lyse some of the adhesions with laparoscopic scissors but it became evident that there was significant bowel fixated into the hernia defect as well as the omentum.  It was difficult to maintain significant insufflation given her obesity.  At this point decision was made to convert to an open procedure.  Using a #15 blade I created midline incision through the patient's previous scar.  I took this down to the hernia sac which was then opened with the cautery.  I then removed all trochars.  Again, the patient was found to have at least a large 10 x 20 cm fascial defect as well as small defects.  Extensive lysis of adhesions was carried out with Metzenbaum scissors in order to free the small bowel and colon as well as the omentum out of the hernia sac.  1 small serosal tear was repaired with interrupted 3-0 Vicryl sutures in the small bowel.  Extensive lysis of adhesions was carried out with Metzenbaum scissors between multiple loops of small bowel as well.  The omentum was freed up from the fascial edges with cautery as well.  She had a large amount of adhesions in  the pelvis as well but the hernia was centered from the umbilicus cephalad so I did not enter the pelvis.  Once all adhesions were freed the fascial defect was evaluated further.  The defect again was quite large.  Even with relaxing incisions it would not be able to be pulled back together.  A 20 cm x 30 cm ventral light ST coated mesh from [employer] was brought to the field.  I placed it underneath the fascia circumferentially.  The mesh was then sewn in place to the fascia circumferentially with interrupted #1 Novafil sutures placed  approximately 2 cm apart.  There was at least 5 cm of overlap in all directions.  Prior to completely attaching the mesh the abdomen was irrigated with several liters normal saline.  Again hemostasis appeared to be achieved.  We had run the small bowel and saw no evidence of any bowel injuries.  The mesh appeared very secure underneath the fascia.  We irrigated above the mesh and the subtenons space with more saline.  The hernia sac was excised in pieces with electrocautery.  Again hemostasis appeared to be achieved.  We then placed [address] [place] [address] drains into the incision through separate skin incision with scalpel.  These were sutured in place with 2-0 nylon sutures.  We then closed subcutaneous tissue with interrupted 3-0 Vicryl sutures and closed the skin with skin staples.  The trocar sites were closed with skin staples as well.  The drains were placed to bulb suction.  The patient tolerated the procedure well.  All the counts were correct at the end of the procedure.  The patient was then extubated in the operating room and taken in a stable condition to the recovery room.          Coralie Keens   Date: 09/20/2020  Time: 2:29 PM

## 2020-09-20 NOTE — Transfer of Care (Signed)
Immediate Anesthesia Transfer of Care Note  Patient: [name]  Procedure(s) Performed: LYSIS OF ADHESIONS (Abdomen) OPEN HERNIA REPAIR INCISIONAL (Abdomen) LAPAROSCOPY DIAGNOSTIC (Abdomen) INSERTION OF MESH (Abdomen)  Patient Location: PACU  Anesthesia Type:General  Level of Consciousness: drowsy and patient cooperative  Airway & Oxygen Therapy: Patient Spontanous Breathing and Patient connected to face mask oxygen  Post-op Assessment: Report given to RN and Post -op Vital signs reviewed and stable  Post vital signs: Reviewed  Last Vitals:  Vitals Value Taken Time  BP 134/92 09/20/20 1435  Temp    Pulse 91 09/20/20 1438  Resp 20 09/20/20 1438  SpO2 95 % 09/20/20 1438  Vitals shown include unvalidated device data.  Last Pain:  Vitals:   09/20/20 1034  TempSrc:   PainSc: 0-No pain         Complications: No notable events documented.

## 2020-09-21 ENCOUNTER — Encounter (HOSPITAL_COMMUNITY): Payer: Self-pay | Admitting: Surgery

## 2020-09-21 LAB — CBC
HCT: 38 % (ref 36.0–46.0)
Hemoglobin: 12 g/dL (ref 12.0–15.0)
MCH: 28.2 pg (ref 26.0–34.0)
MCHC: 31.6 g/dL (ref 30.0–36.0)
MCV: 89.2 fL (ref 80.0–100.0)
Platelets: 261 10*3/uL (ref 150–400)
RBC: 4.26 MIL/uL (ref 3.87–5.11)
RDW: 16 % — ABNORMAL HIGH (ref 11.5–15.5)
WBC: 12.3 10*3/uL — ABNORMAL HIGH (ref 4.0–10.5)
nRBC: 0 % (ref 0.0–0.2)

## 2020-09-21 LAB — BASIC METABOLIC PANEL
Anion gap: 9 (ref 5–15)
BUN: 8 mg/dL (ref 6–20)
CO2: 25 mmol/L (ref 22–32)
Calcium: 9.1 mg/dL (ref 8.9–10.3)
Chloride: 101 mmol/L (ref 98–111)
Creatinine, Ser: 0.67 mg/dL (ref 0.44–1.00)
GFR, Estimated: >60 mL/min (ref >60)
Glucose, Bld: 204 mg/dL — ABNORMAL HIGH (ref 70–99)
Potassium: 4.8 mmol/L (ref 3.5–5.1)
Sodium: 135 mmol/L (ref 135–145)

## 2020-09-21 LAB — GLUCOSE, CAPILLARY
Glucose-Capillary: 138 mg/dL — ABNORMAL HIGH (ref 70–99)
Glucose-Capillary: 150 mg/dL — ABNORMAL HIGH (ref 70–99)
Glucose-Capillary: 144 mg/dL — ABNORMAL HIGH (ref 70–99)
Glucose-Capillary: 157 mg/dL — ABNORMAL HIGH (ref 70–99)
Glucose-Capillary: 170 mg/dL — ABNORMAL HIGH (ref 70–99)

## 2020-09-21 MED ORDER — OXYCODONE HCL 5 MG PO TABS: 5.0000 mg | ORAL_TABLET | Freq: Four times a day (QID) | ORAL | 0 refills | Status: AC | PRN

## 2020-09-21 MED ORDER — CHLORHEXIDINE GLUCONATE CLOTH 2 % EX PADS: 6.0000 | MEDICATED_PAD | Freq: Every day | CUTANEOUS | Status: DC

## 2020-09-21 MED ORDER — INSULIN ASPART 100 UNIT/ML IJ SOLN
0.0000 [IU] | Freq: Three times a day (TID) | INTRAMUSCULAR | Status: DC
  Administered 2020-09-21 (×2): 3 [IU] via SUBCUTANEOUS
  Administered 2020-09-22 (×2): 4 [IU] via SUBCUTANEOUS
  Administered 2020-09-23 (×2): 3 [IU] via SUBCUTANEOUS
  Administered 2020-09-24: 1 [IU] via SUBCUTANEOUS

## 2020-09-21 MED ORDER — INSULIN ASPART 100 UNIT/ML IJ SOLN: 0.0000 [IU] | Freq: Every day | INTRAMUSCULAR | Status: DC

## 2020-09-21 NOTE — Progress Notes (Signed)
Paged MD again. Need orders for an in and out cath.

## 2020-09-21 NOTE — Discharge Instructions (Addendum)
[facility] _____[facility]  UMBILICAL OR INGUINAL HERNIA REPAIR: POST OP INSTRUCTIONS  Always review your discharge instruction sheet given to you by the facility where your surgery was performed. IF YOU HAVE DISABILITY OR FAMILY LEAVE FORMS, YOU MUST BRING THEM TO THE OFFICE FOR PROCESSING.   DO NOT GIVE THEM TO YOUR DOCTOR.  1. A  prescription for pain medication may be given to you upon discharge.  Take your pain medication as prescribed, if needed.  If narcotic pain medicine is not needed, then you may take acetaminophen (Tylenol) or ibuprofen (Advil) as needed. 2. Take your usually prescribed medications unless otherwise directed. If you need a refill on your pain medication, please contact your pharmacy.  They will contact our office to request authorization. Prescriptions will not be filled after 5 pm or on week-ends. 3. You should follow a light diet the first 24 hours after arrival home, such as soup and crackers, etc.  Be sure to include lots of fluids daily.  Resume your normal diet the day after surgery. 4.Most patients will experience some swelling and bruising around the umbilicus or in the groin and scrotum.  Ice packs and reclining will help.  Swelling and bruising can take several days to resolve.  6. It is common to experience some constipation if taking pain medication after surgery.  Increasing fluid intake and taking a stool softener (such as Colace) will usually help or prevent this problem from occurring.  A mild laxative (Milk of Magnesia or Miralax) should be taken according to package directions if there are no bowel movements after 48 hours. 7. Unless discharge instructions indicate otherwise, you may remove your bandages 24-48 hours after surgery, and you may shower at that time.  You may have steri-strips (small skin tapes) in place directly over the incision.  These strips should be left on the skin for 7-10 days.  If your surgeon used skin glue on the  incision, you may shower in 24 hours.  The glue will flake off over the next 2-3 weeks.  Any sutures or staples will be removed at the office during your follow-up visit. 8. ACTIVITIES:  You may resume regular (light) daily activities beginning the next day--such as daily self-care, walking, climbing stairs--gradually increasing activities as tolerated.  You may have sexual intercourse when it is comfortable.  Refrain from any heavy lifting or straining until approved by your doctor.  a.You may drive when you are no longer taking prescription pain medication, you can comfortably wear a seatbelt, and you can safely maneuver your car and apply brakes. b.RETURN TO WORK:   _____________________________________________  9.You should see your doctor in the office for a follow-up appointment approximately 2-3 weeks after your surgery.  Make sure that you call for this appointment within a day or two after you arrive home to insure a convenient appointment time. 10.OTHER INSTRUCTIONS: ___NO LIFTING OVER 10 POUNDS FOR 6 TO 8 WEEKS RECORD DRAIN OUTPUT  OK TO SHOWER  RECORD DRAIN OUTPUT ______________________    _____________________________________  WHEN TO CALL YOUR DOCTOR: Fever over 101.0 Inability to urinate Nausea and/or vomiting Extreme swelling or bruising Continued bleeding from incision. Increased pain, redness, or drainage from the incision  The clinic staff is available to answer your questions during regular business hours.  Please don't hesitate to call and ask to speak to one of the nurses for clinical concerns.  If you have a medical emergency, go to the nearest emergency room or call 911.  A surgeon from [facility] is always on call at the hospital   [address] [place], [place], [place]  [address] ?  [address], [place], [place]   [address] [phone] ? [phone] ? FAX [phone] Web site: [email or web address]

## 2020-09-21 NOTE — Progress Notes (Signed)
Pt due to void after FOLEY removal. Bladder scan completed 4x and showed 0 mL, but pt feels very full and uncomfortable.  Called MD for and I&O cath order.  Pt tried time after her bath without [place] with O2 since she was sating in the 90s with O2. Pt visitor came out in hallway and said she was shaking. RN went into room with NT. CBG 157 and had given insulin just a short time before. VS stable except O2--80% Applied 4 L O2. Within 2 minutes, O2 sat back up to 99. Currently on 2 L [place] and pulse ox on.

## 2020-09-21 NOTE — Progress Notes (Signed)
1 Day Post-Op   Subjective/Chief Complaint: Sore post op as expected Denies nausea   Objective: Vital signs in last 24 hours: Temp:  [97.5 F (36.4 C)-98.7 F (37.1 C)] 97.7 F (36.5 C) (09/16 0741) Pulse Rate:  [89-108] 94 (09/16 0741) Resp:  [12-25] 16 (09/16 0741) BP: (102-156)/(65-92) 122/79 (09/16 0741) SpO2:  [90 %-97 %] 90 % (09/16 0741) Weight:  [116.6 kg] 116.6 kg (09/15 1023)    Intake/Output from previous day: 09/15 0701 - 09/16 0700 In: 3358.5 [P.O.:100; I.V.:2808.5; IV Piggyback:450] Out: 2058 [id number]; Drains:258; Blood:150] Intake/Output this shift: No intake/output data recorded.  Exam: Awake and alert Abdomen soft, dressing dry, drains serosang  Lab Results:  Recent Labs    09/21/20 0043  WBC 12.3*  HGB 12.0  HCT 38.0  PLT 261   BMET Recent Labs    09/21/20 0043  NA 135  K 4.8  CL 101  CO2 25  GLUCOSE 204*  BUN 8  CREATININE 0.67  CALCIUM 9.1   PT/INR No results for input(s): LABPROT, INR in the last 72 hours. ABG No results for input(s): PHART, HCO3 in the last 72 hours.  Invalid input(s): PCO2, PO2  Studies/Results: No results found.  Anti-infectives: Anti-infectives (From admission, onward)    Start     Dose/Rate Route Frequency Ordered Stop   09/20/20 1200  ciprofloxacin (CIPRO) IVPB 400 mg        400 mg 200 mL/hr over 60 Minutes Intravenous To Surgery 09/20/20 1148 09/20/20 1205   09/20/20 1030  ceFAZolin (ANCEF) IVPB 2g/100 mL premix  Status:  Discontinued        2 g 200 mL/hr over 30 Minutes Intravenous On call to O.R. 09/20/20 1024 09/20/20 1148   09/20/20 1028  ceFAZolin (ANCEF) 3-0.9 GM/100ML-% IVPB  Status:  Discontinued       Note to Pharmacy: Odelia Gage   : cabinet override      09/20/20 1028 09/20/20 1044       Assessment/Plan: S/p lap converted to open incisional hernia repair with mesh  Ambulate D/c foley Pain control Advance diet Anticipate discharge monday  LOS: 1 day    Coralie Keens 09/21/2020

## 2020-09-22 LAB — CBC
HCT: 37.8 % (ref 36.0–46.0)
Hemoglobin: 11.8 g/dL — ABNORMAL LOW (ref 12.0–15.0)
MCH: 27.9 pg (ref 26.0–34.0)
MCHC: 31.2 g/dL (ref 30.0–36.0)
MCV: 89.4 fL (ref 80.0–100.0)
Platelets: 311 10*3/uL (ref 150–400)
RBC: 4.23 MIL/uL (ref 3.87–5.11)
RDW: 16.4 % — ABNORMAL HIGH (ref 11.5–15.5)
WBC: 12.4 10*3/uL — ABNORMAL HIGH (ref 4.0–10.5)
nRBC: 0 % (ref 0.0–0.2)

## 2020-09-22 LAB — GLUCOSE, CAPILLARY
Glucose-Capillary: 155 mg/dL — ABNORMAL HIGH (ref 70–99)
Glucose-Capillary: 177 mg/dL — ABNORMAL HIGH (ref 70–99)
Glucose-Capillary: 107 mg/dL — ABNORMAL HIGH (ref 70–99)
Glucose-Capillary: 165 mg/dL — ABNORMAL HIGH (ref 70–99)

## 2020-09-22 LAB — BASIC METABOLIC PANEL
Anion gap: 10 (ref 5–15)
BUN: 7 mg/dL (ref 6–20)
CO2: 23 mmol/L (ref 22–32)
Calcium: 9.1 mg/dL (ref 8.9–10.3)
Chloride: 105 mmol/L (ref 98–111)
Creatinine, Ser: 0.8 mg/dL (ref 0.44–1.00)
GFR, Estimated: >60 mL/min (ref >60)
Glucose, Bld: 172 mg/dL — ABNORMAL HIGH (ref 70–99)
Potassium: 4.7 mmol/L (ref 3.5–5.1)
Sodium: 138 mmol/L (ref 135–145)

## 2020-09-22 MED ORDER — METFORMIN HCL 500 MG PO TABS
1000.0000 mg | ORAL_TABLET | Freq: Two times a day (BID) | ORAL | Status: DC
  Administered 2020-09-22 – 2020-09-24 (×4): 1000 mg via ORAL
  Filled 2020-09-22 (×4): qty 2

## 2020-09-22 MED ORDER — SEMAGLUTIDE 3 MG PO TABS
6.0000 mg | ORAL_TABLET | Freq: Every day | ORAL | Status: DC
  Administered 2020-09-22 – 2020-09-24 (×3): 6 mg via ORAL
  Filled 2020-09-22 (×3): qty 1

## 2020-09-22 MED ORDER — SEMAGLUTIDE 7 MG PO TABS: 6.0000 mg | ORAL_TABLET | Freq: Every day | ORAL | Status: DC

## 2020-09-22 MED ORDER — SEMAGLUTIDE 7 MG PO TABS: 7.0000 mg | ORAL_TABLET | Freq: Every day | ORAL | Status: DC

## 2020-09-22 MED ORDER — SEMAGLUTIDE 3 MG PO TABS: 6.0000 mg | ORAL_TABLET | Freq: Every day | ORAL | Status: DC

## 2020-09-22 NOTE — Progress Notes (Signed)
2 Days Post-Op    CC: Incisional hernia  Subjective: Patient is lying in bed looks fairly comfortable but is extremely sore.  Both JP drains have serosanguineous fluids fairly bloody still.  She is tolerating a soft diet.  Incision looks fine.  Objective: Vital signs in last 24 hours: Temp:  [97.5 F (36.4 C)-98.7 F (37.1 C)] 98.5 F (36.9 C) (09/17 0800) Pulse Rate:  [93-115] 106 (09/17 0800) Resp:  [17-19] 19 (09/16 1818) BP: (112-135)/(57-83) 118/68 (09/17 0800) SpO2:  [94 %-99 %] 96 % (09/17 0800) Weight:  [120.2 kg] 120.2 kg (09/17 0344) Last BM Date: 09/19/20 680 p.o. 1900 IV 1220 urine 200 drains 75 the left side 125 right side No BM recorded Afebrile slightly tachycardic heart rate in the 100 range. WBC 12.3>> 12.4 Glucose 157-170 Intake/Output from previous day: 09/16 0701 - 09/17 0700 In: 2584.6 [P.O.:680; I.V.:1904.6] Out: 1420 [Urine:1220; Drains:200] Intake/Output this shift: No intake/output data recorded.  General appearance: alert, cooperative, and no distress Resp: Clear anterior, and getting I-S for her to use. GI: Large abdomen, bowel sounds are hypoactive.  Incision looks good.  Tolerating carb modified diet.  Lab Results:  Recent Labs    09/21/20 0043 09/22/20 0355  WBC 12.3* 12.4*  HGB 12.0 11.8*  HCT 38.0 37.8  PLT 261 311    BMET Recent Labs    09/21/20 0043 09/22/20 0355  NA 135 138  K 4.8 4.7  CL 101 105  CO2 25 23  GLUCOSE 204* 172*  BUN 8 7  CREATININE 0.67 0.80  CALCIUM 9.1 9.1   PT/INR No results for input(s): LABPROT, INR in the last 72 hours.  No results for input(s): AST, ALT, ALKPHOS, BILITOT, PROT, ALBUMIN in the last 168 hours.   Lipase     Component Value Date/Time   LIPASE 35 01/16/2014 1129     Medications:  acetaminophen  1,000 mg Oral Q6H   enoxaparin (LOVENOX) injection  40 mg Subcutaneous Q24H   escitalopram  10 mg Oral Daily   gabapentin  300 mg Oral BID   insulin aspart  0-20 Units  Subcutaneous TID WC   insulin aspart  0-5 Units Subcutaneous QHS   losartan  50 mg Oral Daily   pantoprazole (PROTONIX) IV  40 mg Intravenous QHS    0.9 % NaCl with KCl 20 mEq / L 75 mL/hr at 09/22/20 0645    Antibiotics Given (last 72 hours)     Date/Time Action Medication Dose   09/20/20 1205 Given   ciprofloxacin (CIPRO) IVPB 400 mg 400 mg        Assessment/Plan  Incisional hernia Laparoscopic converted to open incisional hernia repair with mesh, lysis of adhesions, 09/20/2020, DR. Coralie Keens POD #2  -Plan: We will continue her on sliding scale insulin.  I have reordered her semaglutide and glucophage.  Continue to mobilize and monitor thru the weekend.    FEN: Carb modified/IV fluid ID: Cipro preop DVT: Lovenox 40 mg q24h  Hx PE on Eliquis at home Hx diabetes without complication GERD Hypertension Sleep apnea Asthma      LOS: 2 days    [name] 09/22/2020 Please see Amion

## 2020-09-23 LAB — GLUCOSE, CAPILLARY
Glucose-Capillary: 132 mg/dL — ABNORMAL HIGH (ref 70–99)
Glucose-Capillary: 143 mg/dL — ABNORMAL HIGH (ref 70–99)
Glucose-Capillary: 96 mg/dL (ref 70–99)
Glucose-Capillary: 185 mg/dL — ABNORMAL HIGH (ref 70–99)

## 2020-09-23 NOTE — Progress Notes (Signed)
3 Days Post-Op    CC: Hernia repair  Subjective: Patient was coming back from the bathroom, she is using a rolling walker.  She still pretty sore but her midline incision looks good.  Fair amount of drainage as listed below from her JPs.  Objective: Vital signs in last 24 hours: Temp:  [98.4 F (36.9 C)-99.3 F (37.4 C)] 98.4 F (36.9 C) (09/18 0738) Pulse Rate:  [88-99] 88 (09/18 0738) Resp:  [16-18] 16 (09/18 0738) BP: (124-143)/(66-80) 143/80 (09/18 0738) SpO2:  [96 %-98 %] 96 % (09/18 0738) Last BM Date: 09/19/20 360 p.o. recorded 915 IV record Voided x3  left drain 55 cc Right drain 95 cc Afebrile vital signs are stable No labs today CBGs well controlled 107-177 Intake/Output from previous day: 09/17 0701 - 09/18 0700 In: 1275.4 [P.O.:360; I.V.:915.4] Out: 150 [Drains:150] Intake/Output this shift: No intake/output data recorded.  General appearance: alert, cooperative, and no distress Resp: clear to auscultation bilaterally GI: Soft, sore, positive bowel sounds, tolerating soft diet midline incision looks good.  Lab Results:  Recent Labs    09/21/20 0043 09/22/20 0355  WBC 12.3* 12.4*  HGB 12.0 11.8*  HCT 38.0 37.8  PLT 261 311    BMET Recent Labs    09/21/20 0043 09/22/20 0355  NA 135 138  K 4.8 4.7  CL 101 105  CO2 25 23  GLUCOSE 204* 172*  BUN 8 7  CREATININE 0.67 0.80  CALCIUM 9.1 9.1   PT/INR No results for input(s): LABPROT, INR in the last 72 hours.  No results for input(s): AST, ALT, ALKPHOS, BILITOT, PROT, ALBUMIN in the last 168 hours.   Lipase     Component Value Date/Time   LIPASE 35 01/16/2014 1129     Medications:  acetaminophen  1,000 mg Oral Q6H   enoxaparin (LOVENOX) injection  40 mg Subcutaneous Q24H   escitalopram  10 mg Oral Daily   gabapentin  300 mg Oral BID   insulin aspart  0-20 Units Subcutaneous TID WC   insulin aspart  0-5 Units Subcutaneous QHS   losartan  50 mg Oral Daily   metFORMIN  1,000 mg Oral  BID WC   pantoprazole (PROTONIX) IV  40 mg Intravenous QHS   Semaglutide  6 mg Oral Daily    Assessment/Plan Incisional hernia Laparoscopic converted to open incisional hernia repair with mesh, lysis of adhesions, 09/20/2020, DR. Coralie Keens POD #3  -Plan: We will continue her on sliding scale insulin.  Continue to mobilize with assistance, aim for discharge tomorrow.  FEN: Carb modified/IV fluid ID: Cipro preop DVT: Lovenox 40 mg q24h   Hx PE on Eliquis at home Hx diabetes without complication GERD Hypertension Sleep apnea Asthma         LOS: 3 days    Heidie Krall 09/23/2020 Please see Amion

## 2020-09-24 LAB — GLUCOSE, CAPILLARY: Glucose-Capillary: 129 mg/dL — ABNORMAL HIGH (ref 70–99)

## 2020-09-24 NOTE — Progress Notes (Signed)
Medication that was sent to pharmacy upon admission returned to patient totally of 20 pills; counted with patient and sister at bedside. Drain emptying and recording was taught to patient and sister with positive teach-back. Discharge summary reviewed with patient and all questions were answered. Patient was wheeled down stair by volunteer staff.

## 2020-09-24 NOTE — Discharge Summary (Signed)
Physician Discharge Summary  Patient ID: [name] MRN: [id number] DOB/AGE: 58-Jun-1962 58 y.o.  Admit date: 09/20/2020 Discharge date: 09/24/2020  Admission Diagnoses:  Discharge Diagnoses:  Active Problems:   Incisional hernia   Discharged Condition: good  Hospital Course: uneventful post op recovery.  Discharge home POD#4 doing well  Consults: None  Significant Diagnostic Studies:   Treatments: surgery: lap converted to open incisional hernia repair with mesh, lysis of adhesions  Discharge Exam: Blood pressure (!) 148/83, pulse 80, temperature 98.1 F (36.7 C), temperature source Oral, resp. rate 16, height '5\' 5"'$  (1.651 m), weight 120.2 kg, SpO2 94 %. General appearance: alert, cooperative, and no distress Resp: clear to auscultation bilaterally Cardio: regular rate and rhythm, S1, S2 normal, no murmur, click, rub or gallop Incision/Wound:abdomen soft, drains serosang, incision healing well  Disposition: Discharge disposition: 01-Home or Self Care        Allergies as of 09/24/2020       Reactions   Shellfish Allergy Anaphylaxis, Swelling   Demerol [meperidine] Hives   Phenergan [promethazine Hcl] Hives   Contrast Media [iodinated Diagnostic Agents] Swelling, Rash   Penicillins Rash   Reaction: 15 years ago        Medication List     TAKE these medications    albuterol (2.5 MG/3ML) 0.083% nebulizer solution Commonly known as: PROVENTIL Inhale 3 mLs into the lungs every 6 (six) hours as needed for wheezing or shortness of breath.   aspirin 81 MG EC tablet Take 1 tablet (81 mg total) by mouth daily.   atorvastatin 40 MG tablet Commonly known as: LIPITOR Take 40 mg by mouth daily.   Eliquis DVT/PE Starter Pack 5 MG Tabs Take as directed on package: start with two-'5mg'$  tablets twice daily for 7 days. On day 8, switch to one-'5mg'$  tablet twice daily.   apixaban 5 MG Tabs tablet Commonly known as: Eliquis Take 1 tablet (5 mg total) by mouth 2 (two)  times daily.   EPINEPHrine 0.3 mg/0.3 mL Soaj injection Commonly known as: EPI-PEN Inject 0.3 mg into the muscle daily as needed for anaphylaxis (allergic reaction).   escitalopram 10 MG tablet Commonly known as: LEXAPRO Take 10 mg by mouth daily.   losartan 50 MG tablet Commonly known as: COZAAR Take 50 mg by mouth daily.   metFORMIN 500 MG tablet Commonly known as: GLUCOPHAGE Take 1,000 mg by mouth 2 (two) times daily with a meal.   oxyCODONE 5 MG immediate release tablet Commonly known as: Oxy IR/ROXICODONE Take 1 tablet (5 mg total) by mouth every 6 (six) hours as needed for moderate pain, severe pain or breakthrough pain.   pantoprazole 40 MG tablet Commonly known as: PROTONIX Take 1 tablet (40 mg total) by mouth daily. What changed: how much to take   Rybelsus 7 MG Tabs Generic drug: Semaglutide Take 7 mg by mouth daily.   vitamin C 1000 MG tablet Take 1,000 mg by mouth daily.   Vitamin D 50 MCG (2000 UT) Caps Take 2,000 Units by mouth daily.   vitamin E 180 MG (400 UNITS) capsule Take 400 Units by mouth daily.        Follow-up Information     Coralie Keens, MD Follow up on 10/05/2020.   Specialty: General Surgery Why: For suture removal, For wound re-check call office for the time Contact information: [address] [place] [address] [phone]                 Signed:  Coralie Keens 09/24/2020, 8:27 AM

## 2020-09-24 NOTE — Progress Notes (Signed)
Patient ID: [name], female   DOB: Dec 26, 1960, 60 y.o.   MRN: [id number]   Doing well Plan discharge home today

## 2023-12-15 ENCOUNTER — Encounter (HOSPITAL_COMMUNITY): Payer: Self-pay | Admitting: Surgery
